# Patient Record
Sex: Female | Born: 1955 | Race: White | Hispanic: No | Marital: Single | Smoking: Never smoker
Health system: Southern US, Community
[De-identification: ages and names within clinical notes are randomized; demographics above are authoritative.]

## PROBLEM LIST (undated history)

## (undated) DIAGNOSIS — E785 Hyperlipidemia, unspecified: Secondary | ICD-10-CM

## (undated) DIAGNOSIS — Z87442 Personal history of urinary calculi: Secondary | ICD-10-CM

## (undated) DIAGNOSIS — R112 Nausea with vomiting, unspecified: Secondary | ICD-10-CM

## (undated) DIAGNOSIS — Z8701 Personal history of pneumonia (recurrent): Secondary | ICD-10-CM

## (undated) DIAGNOSIS — G454 Transient global amnesia: Secondary | ICD-10-CM

## (undated) DIAGNOSIS — E538 Deficiency of other specified B group vitamins: Secondary | ICD-10-CM

## (undated) DIAGNOSIS — R079 Chest pain, unspecified: Secondary | ICD-10-CM

## (undated) DIAGNOSIS — Z9889 Other specified postprocedural states: Secondary | ICD-10-CM

## (undated) DIAGNOSIS — N200 Calculus of kidney: Secondary | ICD-10-CM

## (undated) DIAGNOSIS — M79606 Pain in leg, unspecified: Secondary | ICD-10-CM

## (undated) HISTORY — DX: Hyperlipidemia, unspecified: E78.5

## (undated) HISTORY — DX: Chest pain, unspecified: R07.9

## (undated) HISTORY — DX: Deficiency of other specified B group vitamins: E53.8

## (undated) HISTORY — DX: Transient global amnesia: G45.4

## (undated) HISTORY — DX: Pain in leg, unspecified: M79.606

## (undated) HISTORY — DX: Personal history of pneumonia (recurrent): Z87.01

## (undated) HISTORY — DX: Calculus of kidney: N20.0

## (undated) HISTORY — DX: Personal history of urinary calculi: Z87.442

---

## 1988-11-13 DIAGNOSIS — Z8701 Personal history of pneumonia (recurrent): Secondary | ICD-10-CM

## 1988-11-13 HISTORY — DX: Personal history of pneumonia (recurrent): Z87.01

## 1992-11-13 HISTORY — PX: CHOLECYSTECTOMY: SHX55

## 1996-11-13 HISTORY — PX: EXPLORATORY LAPAROTOMY: SUR591

## 1997-11-13 HISTORY — PX: VAGINAL HYSTERECTOMY: SUR661

## 1998-10-26 ENCOUNTER — Inpatient Hospital Stay (HOSPITAL_COMMUNITY): Admission: RE | Admit: 1998-10-26 | Discharge: 1998-10-27 | Payer: Self-pay | Admitting: Obstetrics and Gynecology

## 2000-11-21 ENCOUNTER — Encounter: Payer: Self-pay | Admitting: Obstetrics and Gynecology

## 2000-11-21 ENCOUNTER — Encounter: Admission: RE | Admit: 2000-11-21 | Discharge: 2000-11-21 | Payer: Self-pay | Admitting: Obstetrics and Gynecology

## 2005-02-02 ENCOUNTER — Ambulatory Visit: Payer: Self-pay | Admitting: Cardiology

## 2005-02-08 ENCOUNTER — Ambulatory Visit: Payer: Self-pay

## 2005-02-08 ENCOUNTER — Ambulatory Visit: Payer: Self-pay | Admitting: Cardiology

## 2010-10-27 ENCOUNTER — Ambulatory Visit: Payer: Self-pay | Admitting: Family Medicine

## 2010-11-13 HISTORY — PX: VEIN LIGATION AND STRIPPING: SHX2653

## 2011-01-17 ENCOUNTER — Telehealth: Payer: Self-pay | Admitting: Cardiology

## 2011-01-23 ENCOUNTER — Ambulatory Visit: Payer: Self-pay | Admitting: Internal Medicine

## 2011-01-24 NOTE — Progress Notes (Signed)
Summary: hands turning blue  Phone Note Call from Patient Call back at [phone]   Caller: Patient Reason for Call: Talk to Nurse Summary of Call: pt states her hands started truning blue. pt states hands were blue all day yesterday. blood pressure was 117/77 last night.  pt has been having headaches as well. pt has not been here since 2006.  Initial call taken by: Roe Coombs,  January 17, 2011 9:21 AM  Follow-up for Phone Call        yesterday am - hand started turning blue (mainly palms of her hands)- came and went,  last night it went away.  Checked BP and it was OK.  Goes to headache clinic for mirgraines  but doesn't have a primary care MD.  Under alot of stress, going thru a divorce.  Pain in back between shoulder blades like a twitching.  Twitching also in face.   Also alot of fatigue.  not sleeping well.  SOB walking up stairs for the last several months. Does have a family history of CAD.  No chest pain.  Requested pt report to urgent care for eval and referral Follow-up by: Charolotte Capuchin, RN,  January 17, 2011 11:35 AM

## 2011-02-01 ENCOUNTER — Ambulatory Visit (INDEPENDENT_AMBULATORY_CARE_PROVIDER_SITE_OTHER): Payer: BC Managed Care – PPO | Admitting: Family Medicine

## 2011-02-01 ENCOUNTER — Encounter: Payer: Self-pay | Admitting: Family Medicine

## 2011-02-01 VITALS — BP 122/68 | HR 80 | Temp 98.3°F | Ht 62.5 in | Wt 158.1 lb

## 2011-02-01 DIAGNOSIS — N200 Calculus of kidney: Secondary | ICD-10-CM | POA: Insufficient documentation

## 2011-02-01 DIAGNOSIS — G43909 Migraine, unspecified, not intractable, without status migrainosus: Secondary | ICD-10-CM

## 2011-02-01 DIAGNOSIS — I998 Other disorder of circulatory system: Secondary | ICD-10-CM

## 2011-02-01 DIAGNOSIS — G43009 Migraine without aura, not intractable, without status migrainosus: Secondary | ICD-10-CM | POA: Insufficient documentation

## 2011-02-01 DIAGNOSIS — H409 Unspecified glaucoma: Secondary | ICD-10-CM | POA: Insufficient documentation

## 2011-02-01 DIAGNOSIS — R5383 Other fatigue: Secondary | ICD-10-CM

## 2011-02-01 DIAGNOSIS — R5381 Other malaise: Secondary | ICD-10-CM

## 2011-02-01 DIAGNOSIS — R0789 Other chest pain: Secondary | ICD-10-CM

## 2011-02-01 DIAGNOSIS — I878 Other specified disorders of veins: Secondary | ICD-10-CM

## 2011-02-01 MED ORDER — ASPIRIN EC 81 MG PO TBEC: 81.0000 mg | DELAYED_RELEASE_TABLET | ORAL | Status: DC

## 2011-02-01 NOTE — Patient Instructions (Addendum)
Return at your convenience fasting for blood work. Start baby aspirin every other day or three times a week to see if we can help with the veins. We will call you with results of blood work. Return at your convenience in next few months for complete physical. Randie Heinz to meet you today, call clinic with questions.

## 2011-02-01 NOTE — Progress Notes (Signed)
Subjective:    Patient ID: Kelly Francis, female    DOB: May 17, 1956, 54 y.o.   MRN: [id number]  CC: new patient, establish  HPI Kelly Francis presents today to establish care with PCP, has had none for several years.  Prior saw Dr. Melrose Nakayama.    Migraines - on topamax, [facility] to [facility])  Tingling sensation - Bad migraine 01/06/2010, given injections at [facility].  Felt nauseated during injections, felt "funny".  1 wk later started having chest/R arm tingling as well as neck and back tingling.  Also noticed recently increasing fatigue.  Notes increased work stress and stress at home.  Currently going through bad divorce.  However doesn't think anxiety contributing because doesn't tend to get upset/anxious.  Also starting to have indigestion, when awakens.  Also with pain in middle of back.  All of this is intermittent.  A main concern she has is that she has started noticing veins protruding in hands, feet, no pain.  Worried because has strong family history of CAD.  Surgical Menopause - hyst age 47.  Preventative: Well woman last year with [facility]), normal pap, breast in last year, nl mammo (due for mammo but will set up through OBGYN).   Colon screening - hasn't discussed. Tetanus shot - unsure, requests to hold off for now. Recent blood work at [facility] clinic, but unsure what.  Requests blood work today.  Allergies, meds, PMHx, SurgHx, SHx, and FmHx reviewed for relevance.  Review of Systems  Constitutional: Negative for fever, chills, activity change, appetite change, fatigue and unexpected weight change.  HENT: Negative for hearing loss and neck pain.   Eyes: Negative for visual disturbance.  Respiratory: Negative for cough, chest tightness, shortness of breath and wheezing.   Cardiovascular: Negative for chest pain and leg swelling.  Gastrointestinal: Negative for nausea, vomiting, abdominal pain, diarrhea, constipation, blood in stool, abdominal  distention and anal bleeding.  Genitourinary: Negative for hematuria and difficulty urinating.  Musculoskeletal: Negative for myalgias and arthralgias.  Skin: Negative for color change and rash.  Neurological: Negative for dizziness, seizures, syncope and headaches.  Psychiatric/Behavioral: Negative for dysphoric mood. The patient is not nervous/anxious.        Objective:   Physical Exam  Nursing note and vitals reviewed. Constitutional: She is oriented to person, place, and time. She appears well-developed and well-nourished.  HENT:  Head: Normocephalic and atraumatic.  Right Ear: External ear normal.  Left Ear: External ear normal.  Nose: Nose normal.  Mouth/Throat: Oropharynx is clear and moist.  Eyes: Conjunctivae and EOM are normal. Pupils are equal, round, and reactive to light.  Neck: Normal range of motion. Neck supple. No thyromegaly present.  Cardiovascular: Normal rate, regular rhythm, normal heart sounds and intact distal pulses.   Pulses:      Radial pulses are 2+ on the right side, and 2+ on the left side.       Dorsalis pedis pulses are 2+ on the right side, and 2+ on the left side.       Marked veins dorsal hands and feet.  Also medial ankles with reticular veins, nontender  Pulmonary/Chest: Effort normal and breath sounds normal. She has no wheezes. She has no rales.  Abdominal: Soft. Bowel sounds are normal. She exhibits no distension and no mass. There is no tenderness. There is no rebound and no guarding.  Musculoskeletal: Normal range of motion.  Lymphadenopathy:    She has no cervical adenopathy.  Neurological: She is  alert and oriented to person, place, and time.       CN grossly intact, station and gait intact  Skin: Skin is warm and dry.  Psychiatric: She has a normal mood and affect. Her behavior is normal. Judgment and thought content normal.      Assessment & Plan:

## 2011-02-02 DIAGNOSIS — I878 Other specified disorders of veins: Secondary | ICD-10-CM | POA: Insufficient documentation

## 2011-02-02 DIAGNOSIS — R0789 Other chest pain: Secondary | ICD-10-CM | POA: Insufficient documentation

## 2011-02-02 DIAGNOSIS — R5383 Other fatigue: Secondary | ICD-10-CM | POA: Insufficient documentation

## 2011-02-02 NOTE — Assessment & Plan Note (Signed)
Going on 3 wks.  Check blood uwork.pdate on results.

## 2011-02-02 NOTE — Assessment & Plan Note (Signed)
Followed by [facility] (Dr. [facility]).  Currently stable.

## 2011-02-02 NOTE — Assessment & Plan Note (Addendum)
Unclear etiology, doesn't sound cardiac in nature.  No outright pain/discomfort.  No SOB.  More tingling.

## 2011-02-02 NOTE — Assessment & Plan Note (Signed)
With some reticulate veins ankles. Recommended start with baby aspirin qod. Check blood work today,

## 2011-02-03 ENCOUNTER — Telehealth: Payer: Self-pay | Admitting: Physician Assistant

## 2011-02-03 NOTE — Telephone Encounter (Signed)
Pt called tonight to report that she is having a myriad of symptoms. She has not yet been seen by a [facility] cardiology physician but states she has an appointment with [place] on Monday. She is followed in a headache clinic for migraines and had injections of the temple several weeks ago, and since then has occasional blue discoloration of her hands, strange sensations down her neck/arms/back, tingling in her left hand, and 2 weeks ago she states she noticed intermittent bulging of the "blood vessels" in her hands. Advised pt to call physician that did injections but pt states she'd rather have our opinion. She states she saw Dr. Ephraim Hamburger at [facility] last week but do not see any documentation from that visit in EMR, was told to take ASA apparently. She noticed "little welps" on her pinkie tonight and became concerned, was wondering if we could see her. I informed her that our office is closed this weekend and she should proceed to ER if she is concerned that things are changing. She does not want to go tonight but will if things get worse. Also advised she urgent care visit tomorrow to be evaluated in person as it is difficult to assess exactly what's going on over the phone. She denies any CP/SOB. Told her to keep our office informed of what her eval over the weekend shows, whether she decides ER vs Urgent Care. She expressed understanding.

## 2011-02-04 ENCOUNTER — Emergency Department (HOSPITAL_COMMUNITY): Payer: BC Managed Care – PPO

## 2011-02-04 ENCOUNTER — Emergency Department (HOSPITAL_COMMUNITY)
Admission: EM | Admit: 2011-02-04 | Discharge: 2011-02-04 | Disposition: A | Discharge status: Home / Self Care | Payer: BC Managed Care – PPO | Attending: Emergency Medicine | Admitting: Emergency Medicine

## 2011-02-04 DIAGNOSIS — M546 Pain in thoracic spine: Secondary | ICD-10-CM | POA: Insufficient documentation

## 2011-02-04 DIAGNOSIS — R21 Rash and other nonspecific skin eruption: Secondary | ICD-10-CM | POA: Insufficient documentation

## 2011-02-04 DIAGNOSIS — M7989 Other specified soft tissue disorders: Secondary | ICD-10-CM | POA: Insufficient documentation

## 2011-02-04 DIAGNOSIS — K3189 Other diseases of stomach and duodenum: Secondary | ICD-10-CM | POA: Insufficient documentation

## 2011-02-04 DIAGNOSIS — E871 Hypo-osmolality and hyponatremia: Secondary | ICD-10-CM | POA: Insufficient documentation

## 2011-02-04 LAB — DIFFERENTIAL
Basophils Absolute: 0 10*3/uL (ref 0.0–0.1)
Basophils Relative: 1 % (ref 0–1)
Eosinophils Absolute: 0 10*3/uL (ref 0.0–0.7)
Eosinophils Relative: 0 % (ref 0–5)
Lymphocytes Relative: 31 % (ref 12–46)
Lymphs Abs: 1.8 10*3/uL (ref 0.7–4.0)
Monocytes Absolute: 0.5 10*3/uL (ref 0.1–1.0)
Monocytes Relative: 8 % (ref 3–12)
Neutro Abs: 3.6 10*3/uL (ref 1.7–7.7)
Neutrophils Relative %: 61 % (ref 43–77)

## 2011-02-04 LAB — CBC
HCT: 40.1 % (ref 36.0–46.0)
Hemoglobin: 13.6 g/dL (ref 12.0–15.0)
MCH: 30.3 pg (ref 26.0–34.0)
MCHC: 33.9 g/dL (ref 30.0–36.0)
MCV: 89.3 fL (ref 78.0–100.0)
Platelets: 206 10*3/uL (ref 150–400)
RBC: 4.49 MIL/uL (ref 3.87–5.11)
RDW: 12.9 % (ref 11.5–15.5)
WBC: 5.9 10*3/uL (ref 4.0–10.5)

## 2011-02-04 LAB — COMPREHENSIVE METABOLIC PANEL
ALT: 12 U/L (ref 0–35)
AST: 18 U/L (ref 0–37)
Albumin: 3.9 g/dL (ref 3.5–5.2)
Alkaline Phosphatase: 76 U/L (ref 39–117)
BUN: 16 mg/dL (ref 6–23)
CO2: 23 mEq/L (ref 19–32)
Calcium: 9 mg/dL (ref 8.4–10.5)
Chloride: 104 mEq/L (ref 96–112)
Creatinine, Ser: 0.65 mg/dL (ref 0.4–1.2)
GFR calc Af Amer: >60 mL/min (ref >60)
GFR calc non Af Amer: >60 mL/min (ref >60)
Glucose, Bld: 104 mg/dL — ABNORMAL HIGH (ref 70–99)
Potassium: 3.6 mEq/L (ref 3.5–5.1)
Sodium: 134 mEq/L — ABNORMAL LOW (ref 135–145)
Total Bilirubin: 0.5 mg/dL (ref 0.3–1.2)
Total Protein: 6.8 g/dL (ref 6.0–8.3)

## 2011-02-04 LAB — D-DIMER, QUANTITATIVE: D-Dimer, Quant: <0.22 ug/mL-FEU (ref 0.00–0.48)

## 2011-02-06 ENCOUNTER — Ambulatory Visit (INDEPENDENT_AMBULATORY_CARE_PROVIDER_SITE_OTHER): Payer: BC Managed Care – PPO | Admitting: Cardiovascular Disease

## 2011-02-06 ENCOUNTER — Other Ambulatory Visit (INDEPENDENT_AMBULATORY_CARE_PROVIDER_SITE_OTHER): Payer: BC Managed Care – PPO | Admitting: Family Medicine

## 2011-02-06 ENCOUNTER — Encounter: Payer: Self-pay | Admitting: Cardiovascular Disease

## 2011-02-06 DIAGNOSIS — R5381 Other malaise: Secondary | ICD-10-CM

## 2011-02-06 DIAGNOSIS — R5383 Other fatigue: Secondary | ICD-10-CM

## 2011-02-06 DIAGNOSIS — G43009 Migraine without aura, not intractable, without status migrainosus: Secondary | ICD-10-CM

## 2011-02-06 DIAGNOSIS — I878 Other specified disorders of veins: Secondary | ICD-10-CM

## 2011-02-06 DIAGNOSIS — M79609 Pain in unspecified limb: Secondary | ICD-10-CM

## 2011-02-06 DIAGNOSIS — I998 Other disorder of circulatory system: Secondary | ICD-10-CM

## 2011-02-06 DIAGNOSIS — I83893 Varicose veins of bilateral lower extremities with other complications: Secondary | ICD-10-CM

## 2011-02-06 DIAGNOSIS — R0789 Other chest pain: Secondary | ICD-10-CM

## 2011-02-06 DIAGNOSIS — M79603 Pain in arm, unspecified: Secondary | ICD-10-CM

## 2011-02-06 DIAGNOSIS — I83899 Varicose veins of unspecified lower extremities with other complications: Secondary | ICD-10-CM

## 2011-02-06 LAB — CBC WITH DIFFERENTIAL/PLATELET
Basophils Absolute: 0 10*3/uL (ref 0.0–0.1)
Basophils Relative: 0.7 % (ref 0.0–3.0)
Eosinophils Absolute: 0 10*3/uL (ref 0.0–0.7)
Eosinophils Relative: 0.5 % (ref 0.0–5.0)
HCT: 41.1 % (ref 36.0–46.0)
Hemoglobin: 13.9 g/dL (ref 12.0–15.0)
Lymphocytes Relative: 31.3 % (ref 12.0–46.0)
Lymphs Abs: 1.7 10*3/uL (ref 0.7–4.0)
MCHC: 33.8 g/dL (ref 30.0–36.0)
MCV: 91.1 fl (ref 78.0–100.0)
Monocytes Absolute: 0.4 10*3/uL (ref 0.1–1.0)
Monocytes Relative: 7.9 % (ref 3.0–12.0)
Neutro Abs: 3.2 10*3/uL (ref 1.4–7.7)
Neutrophils Relative %: 59.6 % (ref 43.0–77.0)
Platelets: 201 10*3/uL (ref 150.0–400.0)
RBC: 4.51 Mil/uL (ref 3.87–5.11)
RDW: 13.7 % (ref 11.5–14.6)
WBC: 5.3 10*3/uL (ref 4.5–10.5)

## 2011-02-06 LAB — COMPREHENSIVE METABOLIC PANEL
ALT: 12 U/L (ref 0–35)
AST: 14 U/L (ref 0–37)
Albumin: 3.9 g/dL (ref 3.5–5.2)
Alkaline Phosphatase: 85 U/L (ref 39–117)
BUN: 17 mg/dL (ref 6–23)
CO2: 28 mEq/L (ref 19–32)
Calcium: 9.3 mg/dL (ref 8.4–10.5)
Chloride: 106 mEq/L (ref 96–112)
Creatinine, Ser: 0.5 mg/dL (ref 0.4–1.2)
GFR: 128.19 mL/min (ref >60.00)
Glucose, Bld: 88 mg/dL (ref 70–99)
Potassium: 4.2 mEq/L (ref 3.5–5.1)
Sodium: 139 mEq/L (ref 135–145)
Total Bilirubin: 0.4 mg/dL (ref 0.3–1.2)
Total Protein: 6.7 g/dL (ref 6.0–8.3)

## 2011-02-06 LAB — LIPID PANEL
Cholesterol: 196 mg/dL (ref 0–200)
HDL: 81 mg/dL (ref >39.00)
LDL Cholesterol: 96 mg/dL (ref 0–99)
Total CHOL/HDL Ratio: 2
Triglycerides: 94 mg/dL (ref 0.0–149.0)
VLDL: 18.8 mg/dL (ref 0.0–40.0)

## 2011-02-06 LAB — VITAMIN B12: Vitamin B-12: 103 pg/mL — ABNORMAL LOW (ref 211–911)

## 2011-02-06 LAB — TSH: TSH: 0.94 u[IU]/mL (ref 0.35–5.50)

## 2011-02-06 LAB — BRAIN NATRIURETIC PEPTIDE: Pro B Natriuretic peptide (BNP): 37.8 pg/mL (ref 0.0–100.0)

## 2011-02-06 NOTE — Progress Notes (Signed)
   Patient ID: Kelly Francis, female    DOB: 03-11-1956, 55 y.o.   MRN: [id number]  HPI Comments: Pleasant 55 year old woman with no known cardiac history, with history of migraines who presents for evaluation of evaluation of hand and foot vein distention.  She reports that over the past several months, she has noticed increasing distention of the veins in her hands and feet. She has occasional shortness of breath, occasional pain down her arms described as a tingling. She has had numerous shots for migraine type related tension in her head and neck and wonders if this could have something to do with her symptoms. She denies any significant shortness of breath with exertion. She denies any lower extremity edema.  She did go to the emergency room on March 24 and had a complete workup including chest x-ray which was normal, d-dimer which was normal, oral blood panel to a normal CBC, basic metabolic panel with borderline low sodium of 134, normal BNP, vitamin B12 which was low. Also normal TSH.  She is able to exert herself without any difficulty. She plans on going to [place] next week for vacation.  EKG shows normal sinus rhythm with rate 76 beats per minute with no significant ST or T wave changes     Review of Systems  Constitutional: Positive for fatigue.  HENT: Negative.   Eyes: Negative.   Respiratory: Negative.   Cardiovascular:       Swelling of the veins in her hands and feet, some arm discomfort described as a tingling also with chronic neck discomfort.  Gastrointestinal: Negative.   Musculoskeletal: Negative.   Skin: Negative.   Neurological: Negative.   Hematological: Negative.   Psychiatric/Behavioral: Negative.   All other systems reviewed and are negative.    BP 120/84  Pulse 76  Ht 5\' 2"  (1.575 m)  Wt 159 lb (72.122 kg)  BMI 29.08 kg/m2   Physical Exam  Nursing note and vitals reviewed. Constitutional: She is oriented to person, place, and time. She appears  well-developed and well-nourished.  HENT:  Head: Normocephalic.  Nose: Nose normal.  Mouth/Throat: Oropharynx is clear and moist.  Eyes: Conjunctivae are normal. Pupils are equal, round, and reactive to light.  Neck: Normal range of motion. Neck supple. No JVD present.  Cardiovascular: Normal rate, regular rhythm, normal heart sounds and intact distal pulses.  Exam reveals no gallop and no friction rub.   No murmur heard. Pulmonary/Chest: Effort normal and breath sounds normal. No respiratory distress. She has no wheezes. She has no rales. She exhibits no tenderness.  Abdominal: Soft. Bowel sounds are normal. She exhibits no distension. There is no tenderness.  Musculoskeletal: Normal range of motion. She exhibits no edema and no tenderness.  Lymphadenopathy:    She has no cervical adenopathy.  Neurological: She is alert and oriented to person, place, and time. Coordination normal.  Skin: Skin is warm and dry. No rash noted. No erythema.  Psychiatric: She has a normal mood and affect. Her behavior is normal. Judgment and thought content normal.    Assessment and Plan

## 2011-02-06 NOTE — Assessment & Plan Note (Signed)
She does have other issues such as fatigue, neck discomfort. This is likely unrelated to any cardiac issue. She is able to exert herself without any symptoms.

## 2011-02-06 NOTE — Assessment & Plan Note (Addendum)
Etiology of her prominent veins in her hands and feet is likely not a pathologic pain. I do not believe that additional workup is needed. 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 This could be related to the warmer weather. She does use her hands frequently for typing and has very muscular hands. With elevation of her hands, her veins rapidly disappear indicating she does not have high central venous pressure.  We did offer an echocardiogram though I suspect this would be confirming that she does not have any issue. She would like to wait on the echocardiogram. I did not think that she needs a diuretic or other workup. It is reassuring that her BNP and d-dimer is normal.  We did mention that she could do a slow wean off the clonidine to see if this makes any difference with improvement of her veins. She could try half a tab for one week before discontinuing for a trial.

## 2011-02-06 NOTE — Patient Instructions (Signed)
No medications were made today. Please follow up with out clinic as needed. We did discuss possibly performing an echo if you get worsening symptoms. Please call us if we can be of further help.

## 2011-02-06 NOTE — Assessment & Plan Note (Signed)
She does have chronic issues with migraines. I will defer this to Dr. Sharen Hones.

## 2011-02-07 ENCOUNTER — Telehealth: Payer: Self-pay | Admitting: Family Medicine

## 2011-02-07 ENCOUNTER — Encounter: Payer: Self-pay | Admitting: Family Medicine

## 2011-02-07 LAB — VITAMIN D 25 HYDROXY (VIT D DEFICIENCY, FRACTURES): Vit D, 25-Hydroxy: 48 ng/mL (ref 30–89)

## 2011-02-07 NOTE — Telephone Encounter (Signed)
Confirming or of the B-12 IM monthly? Thanks!

## 2011-02-07 NOTE — Telephone Encounter (Signed)
Patient notified and monthly B-12 injections scheduled.

## 2011-02-07 NOTE — Telephone Encounter (Signed)
 monthly.  Thanks.

## 2011-02-07 NOTE — Telephone Encounter (Signed)
Please notify sodium normal range now, kidneys, liver, complete blood count, thyroid all normal.  Cholesterol levels were all normal range.  Vitamin D level was 48, normal.  Offer to send copy of blood work.   B12 was low at 103.  This may be accounting for tingling sensation?  Recommend start B12 shots monthly for 8 months then may either continue or go to oral daily supplementation.

## 2011-02-09 ENCOUNTER — Ambulatory Visit (INDEPENDENT_AMBULATORY_CARE_PROVIDER_SITE_OTHER): Payer: BC Managed Care – PPO | Admitting: Family Medicine

## 2011-02-09 DIAGNOSIS — R5381 Other malaise: Secondary | ICD-10-CM

## 2011-02-09 DIAGNOSIS — R5383 Other fatigue: Secondary | ICD-10-CM

## 2011-02-09 MED ORDER — CYANOCOBALAMIN 1000 MCG/ML IJ SOLN
1000.0000 ug | Freq: Once | INTRAMUSCULAR | Status: AC
  Administered 2011-02-09: 1000 ug via INTRAMUSCULAR

## 2011-02-10 NOTE — Progress Notes (Signed)
B12 injection given

## 2011-02-12 DIAGNOSIS — E538 Deficiency of other specified B group vitamins: Secondary | ICD-10-CM

## 2011-02-12 HISTORY — DX: Deficiency of other specified B group vitamins: E53.8

## 2011-03-01 ENCOUNTER — Encounter: Payer: Self-pay | Admitting: Family Medicine

## 2011-03-01 ENCOUNTER — Ambulatory Visit (INDEPENDENT_AMBULATORY_CARE_PROVIDER_SITE_OTHER): Payer: BC Managed Care – PPO | Admitting: Family Medicine

## 2011-03-01 VITALS — BP 110/78 | HR 80 | Temp 98.4°F | Ht 63.5 in | Wt 162.1 lb

## 2011-03-01 DIAGNOSIS — I998 Other disorder of circulatory system: Secondary | ICD-10-CM

## 2011-03-01 DIAGNOSIS — R5381 Other malaise: Secondary | ICD-10-CM

## 2011-03-01 DIAGNOSIS — R5383 Other fatigue: Secondary | ICD-10-CM

## 2011-03-01 DIAGNOSIS — J309 Allergic rhinitis, unspecified: Secondary | ICD-10-CM

## 2011-03-01 DIAGNOSIS — E538 Deficiency of other specified B group vitamins: Secondary | ICD-10-CM

## 2011-03-01 DIAGNOSIS — Z1211 Encounter for screening for malignant neoplasm of colon: Secondary | ICD-10-CM

## 2011-03-01 DIAGNOSIS — I878 Other specified disorders of veins: Secondary | ICD-10-CM

## 2011-03-01 DIAGNOSIS — Z0000 Encounter for general adult medical examination without abnormal findings: Secondary | ICD-10-CM | POA: Insufficient documentation

## 2011-03-01 DIAGNOSIS — Z9109 Other allergy status, other than to drugs and biological substances: Secondary | ICD-10-CM

## 2011-03-01 DIAGNOSIS — Z0001 Encounter for general adult medical examination with abnormal findings: Secondary | ICD-10-CM | POA: Insufficient documentation

## 2011-03-01 MED ORDER — LORATADINE 10 MG PO TABS: 10.0000 mg | ORAL_TABLET | Freq: Every day | ORAL | Status: DC

## 2011-03-01 MED ORDER — CYANOCOBALAMIN 1000 MCG/ML IJ SOLN
1000.0000 ug | Freq: Once | INTRAMUSCULAR | Status: AC
  Administered 2011-03-01: 1000 ug via INTRAMUSCULAR

## 2011-03-01 NOTE — Progress Notes (Signed)
  Subjective:    Patient ID: Kelly Francis, female    DOB: 07-Oct-1956, 55 y.o.   MRN: [id number]  HPI CC: CPE   Tingling actually better since starting B12 shots.  Also has been taking oral supplements.  Fatigue - continued.  Not better.    Now feeling congested, coughing, feels sputum stuck in throat.  Has had blood as well.  Feels going into ears.  Taking [employer] day medicine.  H/o seasonal allergies - actually worse.  No sick contacts at home.  No smokers at home.  No abd pain, fevers/chills, abd pain, n/v, rashes.  No h/o asthma or COPD.  Preventative:  Well woman last year with [facility] [employer] [facility]), normal pap, breast in last year, nl mammo (due for mammo but will set up through OBGYN).  Colon screening - no family h/o colon cancer, no blood in stool, BM changes.  Had colonoscopy done as well as EGD for IBS (done by [facility], pt thinks ~1980).  s/p cholecystectomy.  Requests iFOB today. Tetanus shot - unsure, declines today.  Medications and allergies reviewed and updated as above. PMhx, SHx, surghx, fmhx reviewed for relevance.  Review of Systems  Constitutional: Negative for fever, chills, activity change, appetite change, fatigue and unexpected weight change.  HENT: Negative for hearing loss and neck pain.   Eyes: Negative for visual disturbance.  Respiratory: Positive for cough. Negative for chest tightness, shortness of breath and wheezing.   Cardiovascular: Negative for chest pain, palpitations and leg swelling.  Gastrointestinal: Negative for nausea, vomiting, abdominal pain, diarrhea, constipation, blood in stool and abdominal distention.  Genitourinary: Negative for hematuria and difficulty urinating.  Musculoskeletal: Negative for myalgias and arthralgias.  Skin: Negative for rash.  Neurological: Positive for headaches. Negative for dizziness, seizures and syncope.  Hematological: Does not bruise/bleed easily.  Psychiatric/Behavioral: Negative for dysphoric mood. The  patient is not nervous/anxious.        Objective:   Physical Exam  Nursing note and vitals reviewed. Constitutional: She is oriented to person, place, and time. She appears well-developed and well-nourished.  HENT:  Head: Normocephalic and atraumatic.  Right Ear: External ear normal.  Left Ear: External ear normal.  Nose: Nose normal.  Mouth/Throat: Oropharynx is clear and moist.  Eyes: Conjunctivae and EOM are normal. Pupils are equal, round, and reactive to light.  Neck: Normal range of motion. Neck supple. No thyromegaly present.  Cardiovascular: Normal rate, regular rhythm, normal heart sounds and intact distal pulses.   No murmur heard. Pulses:      Radial pulses are 2+ on the right side, and 2+ on the left side.  Pulmonary/Chest: Effort normal and breath sounds normal. No respiratory distress. She has no wheezes. She has no rales.  Abdominal: Soft. Bowel sounds are normal. She exhibits no distension and no mass. There is no tenderness. There is no rebound and no guarding.  Musculoskeletal: Normal range of motion. She exhibits no edema.  Lymphadenopathy:    She has no cervical adenopathy.  Neurological: She is alert and oriented to person, place, and time.       CN grossly intact, station and gait intact  Skin: Skin is warm and dry. No rash noted.  Psychiatric: She has a normal mood and affect. Her behavior is normal. Judgment and thought content normal.   Breast/gyn deferred to OB.       Assessment & Plan:

## 2011-03-01 NOTE — Assessment & Plan Note (Addendum)
Reviewed preventive protocols and updated unless pt declined. Discussed blood work, provided with copy. iFOB today.  No fmhx colon cancer, no BM changes or blood in stool

## 2011-03-01 NOTE — Assessment & Plan Note (Signed)
Stable. No pain, pressure sensation, pt will continue to monitor and notify us if change.

## 2011-03-01 NOTE — Assessment & Plan Note (Signed)
Recommend start claritin and nasal saline irrigation, return if worsening or not helping.

## 2011-03-01 NOTE — Patient Instructions (Addendum)
We will send you home with stool kit today. Consider checking with [facility] about last colonoscopy. For allergies - nasal saline irrigation twice a day as well as daily claritin for next 1-2 wks.  Also start either simple mucinex or immediate release guaifenesin with plenty of fluid. Update [place] on how you're doing after trying this.  Notify sooner if any fevers (>101.5), worsening cough, or not improving as expected.

## 2011-03-01 NOTE — Assessment & Plan Note (Signed)
Found to have low B12, on monthly supplementation.  No significant improvement in fatigue, continue to monitor.  Has noted improvement in tingling sensation she was having.

## 2011-03-06 ENCOUNTER — Encounter: Payer: Self-pay | Admitting: Cardiovascular Disease

## 2011-03-07 ENCOUNTER — Ambulatory Visit: Payer: BC Managed Care – PPO

## 2011-03-09 ENCOUNTER — Ambulatory Visit: Payer: BC Managed Care – PPO

## 2011-03-13 ENCOUNTER — Other Ambulatory Visit: Payer: BC Managed Care – PPO

## 2011-03-13 ENCOUNTER — Other Ambulatory Visit (INDEPENDENT_AMBULATORY_CARE_PROVIDER_SITE_OTHER): Payer: BC Managed Care – PPO | Admitting: Family Medicine

## 2011-03-13 DIAGNOSIS — Z1211 Encounter for screening for malignant neoplasm of colon: Secondary | ICD-10-CM

## 2011-03-13 LAB — FECAL OCCULT BLOOD, IMMUNOCHEMICAL: Fecal Occult Bld: NEGATIVE

## 2011-03-14 ENCOUNTER — Encounter: Payer: Self-pay | Admitting: *Deleted

## 2011-03-16 NOTE — Progress Notes (Signed)
Addended byMills Koller on: 03/16/2011 11:16 AM   Modules accepted: Orders

## 2011-04-11 ENCOUNTER — Ambulatory Visit (INDEPENDENT_AMBULATORY_CARE_PROVIDER_SITE_OTHER): Payer: BC Managed Care – PPO | Admitting: Family Medicine

## 2011-04-11 DIAGNOSIS — R5381 Other malaise: Secondary | ICD-10-CM

## 2011-04-11 DIAGNOSIS — R5383 Other fatigue: Secondary | ICD-10-CM

## 2011-04-11 MED ORDER — CYANOCOBALAMIN 1000 MCG/ML IJ SOLN
1000.0000 ug | Freq: Once | INTRAMUSCULAR | Status: AC
  Administered 2011-04-11: 1000 ug via INTRAMUSCULAR

## 2011-04-11 NOTE — Progress Notes (Signed)
B12 injection given today during nurse visit. 

## 2011-04-13 ENCOUNTER — Ambulatory Visit: Payer: BC Managed Care – PPO

## 2011-05-09 ENCOUNTER — Ambulatory Visit (INDEPENDENT_AMBULATORY_CARE_PROVIDER_SITE_OTHER): Payer: BC Managed Care – PPO | Admitting: Family Medicine

## 2011-05-09 DIAGNOSIS — R5383 Other fatigue: Secondary | ICD-10-CM

## 2011-05-09 DIAGNOSIS — R5381 Other malaise: Secondary | ICD-10-CM

## 2011-05-09 MED ORDER — CYANOCOBALAMIN 1000 MCG/ML IJ SOLN
1000.0000 ug | Freq: Once | INTRAMUSCULAR | Status: AC
  Administered 2011-05-09: 1000 ug via INTRAMUSCULAR

## 2011-05-09 NOTE — Progress Notes (Signed)
b12 shot 

## 2011-05-11 ENCOUNTER — Ambulatory Visit: Payer: BC Managed Care – PPO

## 2011-06-08 ENCOUNTER — Ambulatory Visit: Payer: BC Managed Care – PPO

## 2011-06-13 ENCOUNTER — Ambulatory Visit (INDEPENDENT_AMBULATORY_CARE_PROVIDER_SITE_OTHER): Payer: BC Managed Care – PPO | Admitting: Family Medicine

## 2011-06-13 DIAGNOSIS — R5381 Other malaise: Secondary | ICD-10-CM

## 2011-06-13 DIAGNOSIS — R5383 Other fatigue: Secondary | ICD-10-CM

## 2011-06-13 MED ORDER — CYANOCOBALAMIN 1000 MCG/ML IJ SOLN
1000.0000 ug | Freq: Once | INTRAMUSCULAR | Status: AC
  Administered 2011-06-13: 1000 ug via INTRAMUSCULAR

## 2011-06-13 NOTE — Progress Notes (Signed)
B12 injection given during nurse visit today. 

## 2011-07-13 ENCOUNTER — Ambulatory Visit: Payer: BC Managed Care – PPO

## 2011-07-18 ENCOUNTER — Ambulatory Visit (INDEPENDENT_AMBULATORY_CARE_PROVIDER_SITE_OTHER): Payer: BC Managed Care – PPO | Admitting: *Deleted

## 2011-07-18 DIAGNOSIS — E538 Deficiency of other specified B group vitamins: Secondary | ICD-10-CM

## 2011-07-18 MED ORDER — CYANOCOBALAMIN 1000 MCG/ML IJ SOLN
1000.0000 ug | Freq: Once | INTRAMUSCULAR | Status: AC
  Administered 2011-07-18: 1000 ug via INTRAMUSCULAR

## 2011-08-10 ENCOUNTER — Ambulatory Visit: Payer: BC Managed Care – PPO

## 2011-08-15 ENCOUNTER — Ambulatory Visit (INDEPENDENT_AMBULATORY_CARE_PROVIDER_SITE_OTHER): Payer: BC Managed Care – PPO | Admitting: *Deleted

## 2011-08-15 DIAGNOSIS — R5381 Other malaise: Secondary | ICD-10-CM

## 2011-08-15 DIAGNOSIS — R5383 Other fatigue: Secondary | ICD-10-CM

## 2011-08-15 MED ORDER — CYANOCOBALAMIN 1000 MCG/ML IJ SOLN
1000.0000 ug | Freq: Once | INTRAMUSCULAR | Status: AC
  Administered 2011-08-15: 1000 ug via INTRAMUSCULAR

## 2011-09-07 ENCOUNTER — Ambulatory Visit: Payer: BC Managed Care – PPO

## 2011-09-12 ENCOUNTER — Ambulatory Visit: Payer: BC Managed Care – PPO

## 2011-09-20 ENCOUNTER — Ambulatory Visit (INDEPENDENT_AMBULATORY_CARE_PROVIDER_SITE_OTHER): Payer: BC Managed Care – PPO | Admitting: *Deleted

## 2011-09-20 DIAGNOSIS — E538 Deficiency of other specified B group vitamins: Secondary | ICD-10-CM

## 2011-09-20 MED ORDER — CYANOCOBALAMIN 1000 MCG/ML IJ SOLN
1000.0000 ug | Freq: Once | INTRAMUSCULAR | Status: AC
  Administered 2011-09-20: 1000 ug via INTRAMUSCULAR

## 2011-10-12 ENCOUNTER — Ambulatory Visit: Payer: BC Managed Care – PPO

## 2011-10-17 ENCOUNTER — Ambulatory Visit: Payer: BC Managed Care – PPO

## 2011-10-24 ENCOUNTER — Ambulatory Visit: Payer: BC Managed Care – PPO

## 2011-11-06 ENCOUNTER — Ambulatory Visit: Payer: BC Managed Care – PPO

## 2011-11-06 DIAGNOSIS — R5381 Other malaise: Secondary | ICD-10-CM

## 2011-11-06 DIAGNOSIS — R5383 Other fatigue: Secondary | ICD-10-CM

## 2011-11-06 MED ORDER — CYANOCOBALAMIN 1000 MCG/ML IJ SOLN
1000.0000 ug | Freq: Once | INTRAMUSCULAR | Status: AC
  Administered 2011-11-06: 1000 ug via INTRAMUSCULAR

## 2011-12-05 ENCOUNTER — Ambulatory Visit (INDEPENDENT_AMBULATORY_CARE_PROVIDER_SITE_OTHER): Payer: BC Managed Care – PPO | Admitting: *Deleted

## 2011-12-05 DIAGNOSIS — E538 Deficiency of other specified B group vitamins: Secondary | ICD-10-CM

## 2011-12-05 MED ORDER — CYANOCOBALAMIN 1000 MCG/ML IJ SOLN
1000.0000 ug | Freq: Once | INTRAMUSCULAR | Status: AC
  Administered 2011-12-05: 1000 ug via INTRAMUSCULAR

## 2012-01-09 ENCOUNTER — Ambulatory Visit (INDEPENDENT_AMBULATORY_CARE_PROVIDER_SITE_OTHER): Payer: BC Managed Care – PPO | Admitting: Family Medicine

## 2012-01-09 DIAGNOSIS — E538 Deficiency of other specified B group vitamins: Secondary | ICD-10-CM

## 2012-01-09 MED ORDER — CYANOCOBALAMIN 1000 MCG/ML IJ SOLN
1000.0000 ug | Freq: Once | INTRAMUSCULAR | Status: AC
  Administered 2012-01-09: 1000 ug via INTRAMUSCULAR

## 2012-01-09 NOTE — Progress Notes (Signed)
b12 shot 

## 2012-02-06 ENCOUNTER — Ambulatory Visit (INDEPENDENT_AMBULATORY_CARE_PROVIDER_SITE_OTHER): Payer: BC Managed Care – PPO | Admitting: *Deleted

## 2012-02-06 DIAGNOSIS — E538 Deficiency of other specified B group vitamins: Secondary | ICD-10-CM

## 2012-02-06 MED ORDER — CYANOCOBALAMIN 1000 MCG/ML IJ SOLN
1000.0000 ug | Freq: Once | INTRAMUSCULAR | Status: AC
  Administered 2012-02-06: 1000 ug via INTRAMUSCULAR

## 2012-03-12 ENCOUNTER — Ambulatory Visit (INDEPENDENT_AMBULATORY_CARE_PROVIDER_SITE_OTHER): Payer: BC Managed Care – PPO | Admitting: *Deleted

## 2012-03-12 DIAGNOSIS — E538 Deficiency of other specified B group vitamins: Secondary | ICD-10-CM

## 2012-03-12 MED ORDER — CYANOCOBALAMIN 1000 MCG/ML IJ SOLN
1000.0000 ug | Freq: Once | INTRAMUSCULAR | Status: AC
  Administered 2012-03-12: 1000 ug via INTRAMUSCULAR

## 2012-03-15 ENCOUNTER — Other Ambulatory Visit: Payer: Self-pay | Admitting: Family Medicine

## 2012-03-15 DIAGNOSIS — R5383 Other fatigue: Secondary | ICD-10-CM

## 2012-03-15 DIAGNOSIS — E538 Deficiency of other specified B group vitamins: Secondary | ICD-10-CM

## 2012-03-15 DIAGNOSIS — Z0000 Encounter for general adult medical examination without abnormal findings: Secondary | ICD-10-CM

## 2012-03-19 ENCOUNTER — Other Ambulatory Visit (INDEPENDENT_AMBULATORY_CARE_PROVIDER_SITE_OTHER): Payer: BC Managed Care – PPO

## 2012-03-19 DIAGNOSIS — Z0000 Encounter for general adult medical examination without abnormal findings: Secondary | ICD-10-CM

## 2012-03-19 DIAGNOSIS — R5383 Other fatigue: Secondary | ICD-10-CM

## 2012-03-19 DIAGNOSIS — E538 Deficiency of other specified B group vitamins: Secondary | ICD-10-CM

## 2012-03-19 DIAGNOSIS — R5381 Other malaise: Secondary | ICD-10-CM

## 2012-03-19 LAB — CBC WITH DIFFERENTIAL/PLATELET
Basophils Absolute: 0 10*3/uL (ref 0.0–0.1)
Basophils Relative: 0.6 % (ref 0.0–3.0)
Eosinophils Absolute: 0 10*3/uL (ref 0.0–0.7)
Eosinophils Relative: 0.6 % (ref 0.0–5.0)
HCT: 41.2 % (ref 36.0–46.0)
Hemoglobin: 13.8 g/dL (ref 12.0–15.0)
Lymphocytes Relative: 29.3 % (ref 12.0–46.0)
Lymphs Abs: 1.6 10*3/uL (ref 0.7–4.0)
MCHC: 33.5 g/dL (ref 30.0–36.0)
MCV: 90.3 fl (ref 78.0–100.0)
Monocytes Absolute: 0.5 10*3/uL (ref 0.1–1.0)
Monocytes Relative: 8.9 % (ref 3.0–12.0)
Neutro Abs: 3.2 10*3/uL (ref 1.4–7.7)
Neutrophils Relative %: 60.6 % (ref 43.0–77.0)
Platelets: 204 10*3/uL (ref 150.0–400.0)
RBC: 4.56 Mil/uL (ref 3.87–5.11)
RDW: 13.8 % (ref 11.5–14.6)
WBC: 5.3 10*3/uL (ref 4.5–10.5)

## 2012-03-19 LAB — BASIC METABOLIC PANEL
BUN: 15 mg/dL (ref 6–23)
CO2: 25 mEq/L (ref 19–32)
Calcium: 9 mg/dL (ref 8.4–10.5)
Chloride: 108 mEq/L (ref 96–112)
Creatinine, Ser: 0.6 mg/dL (ref 0.4–1.2)
GFR: 108.53 mL/min (ref >60.00)
Glucose, Bld: 95 mg/dL (ref 70–99)
Potassium: 3.8 mEq/L (ref 3.5–5.1)
Sodium: 141 mEq/L (ref 135–145)

## 2012-03-19 LAB — TSH: TSH: 1.1 u[IU]/mL (ref 0.35–5.50)

## 2012-03-19 LAB — VITAMIN B12: Vitamin B-12: 472 pg/mL (ref 211–911)

## 2012-03-19 LAB — LDL CHOLESTEROL, DIRECT: Direct LDL: 109.3 mg/dL

## 2012-03-26 ENCOUNTER — Encounter: Payer: BC Managed Care – PPO | Admitting: Family Medicine

## 2012-04-02 ENCOUNTER — Ambulatory Visit (INDEPENDENT_AMBULATORY_CARE_PROVIDER_SITE_OTHER): Payer: BC Managed Care – PPO | Admitting: Family Medicine

## 2012-04-02 ENCOUNTER — Encounter: Payer: Self-pay | Admitting: Family Medicine

## 2012-04-02 VITALS — BP 126/82 | HR 84 | Temp 98.2°F | Wt 165.8 lb

## 2012-04-02 DIAGNOSIS — Z0000 Encounter for general adult medical examination without abnormal findings: Secondary | ICD-10-CM

## 2012-04-02 DIAGNOSIS — E538 Deficiency of other specified B group vitamins: Secondary | ICD-10-CM

## 2012-04-02 DIAGNOSIS — Z1211 Encounter for screening for malignant neoplasm of colon: Secondary | ICD-10-CM

## 2012-04-02 MED ORDER — VITAMIN B-12 500 MCG PO TABS: 500.0000 ug | ORAL_TABLET | Freq: Every day | ORAL | Status: DC

## 2012-04-02 NOTE — Patient Instructions (Signed)
Great to see you today, call us with questions. Change from B12 injections to oral supplement - take 500 mcg daily. We will recheck levels next year - if staying low again, we will likely need to remain on injections.

## 2012-04-02 NOTE — Assessment & Plan Note (Signed)
Has completed 1 year of IM shots of B12. Change to oral supplementation. If returns low, will need IM longterm. + fmhx b12 def, ?intrinsic factor def.
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
## 2012-04-02 NOTE — Assessment & Plan Note (Signed)
Reviewed preventative protocols, updated unless pt declined (tetanus) Discussed healthy diet and lifestyle. Reviewed blood work in detail with pt. IFOB today.  Well woman and mammogram through OBGYN per pt preference

## 2012-04-16 ENCOUNTER — Other Ambulatory Visit: Payer: Self-pay

## 2012-04-16 NOTE — Telephone Encounter (Signed)
When seen 04/02/12 pt bought Vitamin b12 500 mg OTC. Pharmacy notified pt Vit B12 500 mg ready for pick up. Pt did not realize was going to be called in but co pay is less expensive by rx than OTC. Pt will pick up rx at pharmacy.

## 2013-03-30 ENCOUNTER — Other Ambulatory Visit: Payer: Self-pay | Admitting: Family Medicine

## 2013-03-31 ENCOUNTER — Other Ambulatory Visit: Payer: Self-pay | Admitting: Family Medicine

## 2013-03-31 DIAGNOSIS — Z0000 Encounter for general adult medical examination without abnormal findings: Secondary | ICD-10-CM

## 2013-03-31 DIAGNOSIS — E538 Deficiency of other specified B group vitamins: Secondary | ICD-10-CM

## 2013-04-02 ENCOUNTER — Other Ambulatory Visit (INDEPENDENT_AMBULATORY_CARE_PROVIDER_SITE_OTHER): Payer: BC Managed Care – PPO

## 2013-04-02 DIAGNOSIS — E538 Deficiency of other specified B group vitamins: Secondary | ICD-10-CM

## 2013-04-02 DIAGNOSIS — Z0000 Encounter for general adult medical examination without abnormal findings: Secondary | ICD-10-CM

## 2013-04-02 DIAGNOSIS — R5381 Other malaise: Secondary | ICD-10-CM

## 2013-04-02 DIAGNOSIS — R5383 Other fatigue: Secondary | ICD-10-CM

## 2013-04-02 LAB — BASIC METABOLIC PANEL
BUN: 19 mg/dL (ref 6–23)
CO2: 28 mEq/L (ref 19–32)
Calcium: 8.9 mg/dL (ref 8.4–10.5)
Chloride: 106 mEq/L (ref 96–112)
Creatinine, Ser: 0.6 mg/dL (ref 0.4–1.2)
GFR: 112.36 mL/min (ref >60.00)
Glucose, Bld: 92 mg/dL (ref 70–99)
Potassium: 4.2 mEq/L (ref 3.5–5.1)
Sodium: 139 mEq/L (ref 135–145)

## 2013-04-02 LAB — LDL CHOLESTEROL, DIRECT: Direct LDL: 120.9 mg/dL

## 2013-04-03 ENCOUNTER — Ambulatory Visit: Payer: BC Managed Care – PPO

## 2013-04-03 DIAGNOSIS — R5381 Other malaise: Secondary | ICD-10-CM

## 2013-04-03 LAB — TSH: TSH: 1.12 u[IU]/mL (ref 0.35–5.50)

## 2013-04-03 LAB — VITAMIN B12: Vitamin B-12: 860 pg/mL (ref 211–911)

## 2013-04-09 ENCOUNTER — Ambulatory Visit (INDEPENDENT_AMBULATORY_CARE_PROVIDER_SITE_OTHER): Payer: BC Managed Care – PPO | Admitting: Family Medicine

## 2013-04-09 ENCOUNTER — Encounter: Payer: Self-pay | Admitting: Family Medicine

## 2013-04-09 VITALS — BP 102/68 | HR 64 | Temp 98.1°F | Ht 62.75 in | Wt 176.5 lb

## 2013-04-09 DIAGNOSIS — Z1211 Encounter for screening for malignant neoplasm of colon: Secondary | ICD-10-CM

## 2013-04-09 DIAGNOSIS — R5381 Other malaise: Secondary | ICD-10-CM

## 2013-04-09 DIAGNOSIS — G43009 Migraine without aura, not intractable, without status migrainosus: Secondary | ICD-10-CM

## 2013-04-09 DIAGNOSIS — E538 Deficiency of other specified B group vitamins: Secondary | ICD-10-CM

## 2013-04-09 DIAGNOSIS — Z0000 Encounter for general adult medical examination without abnormal findings: Secondary | ICD-10-CM

## 2013-04-09 DIAGNOSIS — R5383 Other fatigue: Secondary | ICD-10-CM

## 2013-04-09 NOTE — Assessment & Plan Note (Signed)
Chronic persistent fatigue despite B12 supplementation.  Normal Vit D last check.  Will continue to monitor for now. Consider CBC next blood draw.

## 2013-04-09 NOTE — Assessment & Plan Note (Signed)
Resolved with supplementation 

## 2013-04-09 NOTE — Assessment & Plan Note (Signed)
Followed by [facility].
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
## 2013-04-09 NOTE — Assessment & Plan Note (Signed)
Preventative protocols reviewed and updated unless pt declined. Discussed healthy diet and lifestyle.  Encouraged increased fruits/vegetables, start using home treadmill she has. iFOB today. Recommended schedule well woman with OBGYN.

## 2013-04-09 NOTE — Patient Instructions (Signed)
Let's keep an eye on weight.  Diversify fruits and vegetables in diet. Good to see you today, call us with questions. Pass by lab for stool kit today.

## 2013-08-24 LAB — HM MAMMOGRAPHY

## 2014-02-07 ENCOUNTER — Other Ambulatory Visit: Payer: Self-pay | Admitting: Family Medicine

## 2014-02-07 DIAGNOSIS — Z0000 Encounter for general adult medical examination without abnormal findings: Secondary | ICD-10-CM

## 2014-02-11 ENCOUNTER — Ambulatory Visit (INDEPENDENT_AMBULATORY_CARE_PROVIDER_SITE_OTHER): Payer: BC Managed Care – PPO | Admitting: Family Medicine

## 2014-02-11 ENCOUNTER — Encounter: Payer: Self-pay | Admitting: Family Medicine

## 2014-02-11 ENCOUNTER — Other Ambulatory Visit (INDEPENDENT_AMBULATORY_CARE_PROVIDER_SITE_OTHER): Payer: BC Managed Care – PPO

## 2014-02-11 VITALS — BP 118/74 | HR 77 | Temp 98.4°F | Wt 183.0 lb

## 2014-02-11 DIAGNOSIS — Z0000 Encounter for general adult medical examination without abnormal findings: Secondary | ICD-10-CM

## 2014-02-11 DIAGNOSIS — R0789 Other chest pain: Secondary | ICD-10-CM

## 2014-02-11 LAB — CBC WITH DIFFERENTIAL/PLATELET
Basophils Absolute: 0 10*3/uL (ref 0.0–0.1)
Basophils Relative: 0.6 % (ref 0.0–3.0)
Eosinophils Absolute: 0 10*3/uL (ref 0.0–0.7)
Eosinophils Relative: 0.6 % (ref 0.0–5.0)
HCT: 40.8 % (ref 36.0–46.0)
Hemoglobin: 13.6 g/dL (ref 12.0–15.0)
Lymphocytes Relative: 24.8 % (ref 12.0–46.0)
Lymphs Abs: 1.5 10*3/uL (ref 0.7–4.0)
MCHC: 33.4 g/dL (ref 30.0–36.0)
MCV: 88.8 fl (ref 78.0–100.0)
Monocytes Absolute: 0.4 10*3/uL (ref 0.1–1.0)
Monocytes Relative: 6.6 % (ref 3.0–12.0)
Neutro Abs: 4.1 10*3/uL (ref 1.4–7.7)
Neutrophils Relative %: 67.4 % (ref 43.0–77.0)
Platelets: 215 10*3/uL (ref 150.0–400.0)
RBC: 4.6 Mil/uL (ref 3.87–5.11)
RDW: 13.9 % (ref 11.5–14.6)
WBC: 6 10*3/uL (ref 4.5–10.5)

## 2014-02-11 LAB — BASIC METABOLIC PANEL
BUN: 18 mg/dL (ref 6–23)
CO2: 25 mEq/L (ref 19–32)
Calcium: 8.9 mg/dL (ref 8.4–10.5)
Chloride: 105 mEq/L (ref 96–112)
Creatinine, Ser: 0.7 mg/dL (ref 0.4–1.2)
GFR: 96.72 mL/min (ref >60.00)
Glucose, Bld: 92 mg/dL (ref 70–99)
Potassium: 3.9 mEq/L (ref 3.5–5.1)
Sodium: 138 mEq/L (ref 135–145)

## 2014-02-11 LAB — TSH: TSH: 0.4 u[IU]/mL (ref 0.35–5.50)

## 2014-02-11 LAB — LIPID PANEL
Cholesterol: 216 mg/dL — ABNORMAL HIGH (ref 0–200)
HDL: 74.7 mg/dL (ref >39.00)
LDL Cholesterol: 113 mg/dL — ABNORMAL HIGH (ref 0–99)
Total CHOL/HDL Ratio: 3
Triglycerides: 142 mg/dL (ref 0.0–149.0)
VLDL: 28.4 mg/dL (ref 0.0–40.0)

## 2014-02-11 MED ORDER — OMEPRAZOLE 40 MG PO CPDR
40.0000 mg | DELAYED_RELEASE_CAPSULE | Freq: Every day | ORAL | Status: DC
Start: 2014-02-11 — End: 2015-09-03

## 2014-02-11 NOTE — Addendum Note (Signed)
Addended by: Marchia Bond on: 02/11/2014 10:18 AM   Modules accepted: Orders

## 2014-02-11 NOTE — Assessment & Plan Note (Addendum)
Overall atypical chest pain, doubt cardiac in nature.  However does have family history of CAD. Pt endorses normal stress test 9 yrs ago. She does have h/o GI issues including esophagitis, HH in the past. Will treat presumptively for dyspepsia with 3wk course omeprazole 40mg  daily (sent to pharmacy). Red flags to return or seek urgent care discussed. Reassess at CPE visit later this month.  EKG today - NSR rage 65, normal axis, intervals, no acute ST/T changes. rsR V1

## 2014-02-11 NOTE — Progress Notes (Signed)
BP 118/74  Pulse 77  Temp(Src) 98.4 F (36.9 C) (Oral)  Wt 183 lb (83.008 kg)  SpO2 99%   CC: chest, abd discomfort  Subjective:    Patient ID: Kelly Francis, female    DOB: 04-21-1956, 58 y.o.   MRN: [id number]  HPI: Kelly Francis is a 58 y.o. female presenting on 02/11/2014 for Abdominal Pain and Chest Pressure   Over last 1.5 months having twitching and burning in chest, points to left substernal area.  Now discomfort has radiated to epigastric region.  Some radiation to mid back.  4d ago woke up with dull ache in chest that lasted entire day.  Zantac over last few weeks hasn't help.  Laying down to rest improves discomfort.  Activity worsened discomfort.  Some nausea with discomfort.  No dyspnea with chest discomfort.  No vomiting, constipation, diarrhea, dysphagia.  Appetite ok.  Pt started taking florestor probiotic which didn't.  Denies heartburn, acid reflux, water brash.  Avoids spicy foods.  Known h/o HH.  H/o cholecystectomy.  H/o esophagitis in the past.  This feels different.  No discomfort at night time.    Had blood work today - BMP checked.  + fmhx CAD. States she had stress test done at age 69 and normal.  No records from this. Does work in yard for exercise and does walk for exercise - didn't notice any chest discomfort with this. Wt Readings from Last 3 Encounters:  02/11/14 183 lb (83.008 kg)  04/09/13 176 lb 8 oz (80.06 kg)  04/02/12 165 lb 12 oz (75.184 kg)  Body mass index is 32.67 kg/(m^2).   Relevant past medical, surgical, family and social history reviewed and updated as indicated.  Allergies and medications reviewed and updated. Current Outpatient Prescriptions on File Prior to Visit  Medication Sig  . Aspirin-Acetaminophen-Caffeine (EXCEDRIN PO) Take by mouth daily as needed.  . cetirizine (ZYRTEC) 10 MG tablet Take 10 mg by mouth daily as needed for allergies.  . cloNIDine (CATAPRES) 0.1 MG tablet Take 0.1 mg by mouth daily. For hot flashes   .  CVS VITAMIN B-12 500 MCG tablet TAKE 1 TABLET (500 MCG TOTAL) BY MOUTH DAILY.  Marland Kitchen estradiol (ESTRACE) 1 MG tablet Take 1 mg by mouth daily.    Marland Kitchen topiramate (TOPAMAX) 25 MG tablet Take 25 mg by mouth at bedtime.    Marland Kitchen loratadine (CLARITIN) 10 MG tablet Take 1 tablet (10 mg total) by mouth daily.   No current facility-administered medications on file prior to visit.    Review of Systems Per HPI unless specifically indicated above    Objective:    BP 118/74  Pulse 77  Temp(Src) 98.4 F (36.9 C) (Oral)  Wt 183 lb (83.008 kg)  SpO2 99%  Physical Exam  Nursing note and vitals reviewed. Constitutional: She appears well-developed and well-nourished. No distress.  HENT:  Mouth/Throat: Oropharynx is clear and moist. No oropharyngeal exudate.  Eyes: Conjunctivae and EOM are normal. Pupils are equal, round, and reactive to light.  Cardiovascular: Normal rate, regular rhythm, normal heart sounds and intact distal pulses.   No murmur heard. Pulmonary/Chest: Effort normal and breath sounds normal. No respiratory distress. She has no wheezes. She has no rales. She exhibits no tenderness.  Abdominal: Soft. Bowel sounds are normal. She exhibits no distension and no mass. There is no tenderness. There is no rebound and no guarding.  Musculoskeletal: She exhibits no edema.       Assessment & Plan:   Problem  List Items Addressed This Visit   Chest discomfort - Primary     Overall atypical chest pain, doubt cardiac in nature.  However does have family history of CAD. Pt endorses normal stress test 9 yrs ago. She does have h/o GI issues including esophagitis, HH in the past. Will treat presumptively for dyspepsia with 3wk course omeprazole 40mg  daily (sent to pharmacy). Red flags to return or seek urgent care discussed. Reassess at CPE visit later this month.  EKG today - NSR rage 65, normal axis, intervals, no acute ST/T changes. rsR V1    Relevant Orders      EKG 12-Lead (Completed)        Follow up plan: Return if symptoms worsen or fail to improve, for otherwise keep prior appointment.

## 2014-02-11 NOTE — Patient Instructions (Addendum)
I am more suspicious for GI issue causing these symptoms.  Start omeprazole 40mg  daily for next 3 weeks. We will recheck symptoms at physical. Let me know sooner if any worsening symptoms of chest discomfort or any shortness of breath or just not improving with omeprazole. EKG today looking ok. I have added labs to blood work drawn today.

## 2014-02-11 NOTE — Progress Notes (Signed)
Pre visit review using our clinic review tool, if applicable. No additional management support is needed unless otherwise documented below in the visit note. 

## 2014-02-12 ENCOUNTER — Encounter: Payer: Self-pay | Admitting: Family Medicine

## 2014-03-12 ENCOUNTER — Ambulatory Visit (INDEPENDENT_AMBULATORY_CARE_PROVIDER_SITE_OTHER): Payer: BC Managed Care – PPO | Admitting: Family Medicine

## 2014-03-12 ENCOUNTER — Encounter: Payer: Self-pay | Admitting: Family Medicine

## 2014-03-12 ENCOUNTER — Other Ambulatory Visit: Payer: Self-pay | Admitting: *Deleted

## 2014-03-12 VITALS — BP 118/86 | HR 80 | Temp 97.9°F | Ht 62.5 in | Wt 184.8 lb

## 2014-03-12 DIAGNOSIS — M79672 Pain in left foot: Secondary | ICD-10-CM | POA: Insufficient documentation

## 2014-03-12 DIAGNOSIS — E785 Hyperlipidemia, unspecified: Secondary | ICD-10-CM | POA: Insufficient documentation

## 2014-03-12 DIAGNOSIS — M79671 Pain in right foot: Secondary | ICD-10-CM | POA: Insufficient documentation

## 2014-03-12 DIAGNOSIS — M722 Plantar fascial fibromatosis: Secondary | ICD-10-CM

## 2014-03-12 DIAGNOSIS — Z0000 Encounter for general adult medical examination without abnormal findings: Secondary | ICD-10-CM

## 2014-03-12 MED ORDER — CYANOCOBALAMIN 500 MCG PO TABS: ORAL_TABLET | ORAL | Status: DC

## 2014-03-12 NOTE — Patient Instructions (Addendum)
For plantar fasciitis - buy heel cushion to use, do frozen water bottle massage and do stretching exercises provided today.  Avoid heels. May use ibuprofen as needed. Next time you're there, ask [facility] to send me a copy of your mammogram. Start omeprazole every other day to see if symptoms stay well controlled. Good to see you today, call us with questions.

## 2014-03-12 NOTE — Assessment & Plan Note (Signed)
Preventative protocols reviewed and updated unless pt declined. Discussed healthy diet and lifestyle.  
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
## 2014-03-12 NOTE — Assessment & Plan Note (Signed)
Anticipate plantar fasciitis - discussed treatment recommendations as per pt instructions. Handout provided from [facility] pt advisor with stretching exercises.

## 2014-03-12 NOTE — Progress Notes (Signed)
Pre visit review using our clinic review tool, if applicable. No additional management support is needed unless otherwise documented below in the visit note. 

## 2014-03-12 NOTE — Assessment & Plan Note (Signed)
Mild. Discussed dietary choices to improve lipid levels.

## 2014-03-13 ENCOUNTER — Other Ambulatory Visit: Payer: BC Managed Care – PPO

## 2014-03-13 DIAGNOSIS — Z1211 Encounter for screening for malignant neoplasm of colon: Secondary | ICD-10-CM

## 2014-03-13 LAB — FECAL OCCULT BLOOD, GUAIAC: Fecal Occult Blood: NEGATIVE

## 2014-03-13 LAB — FECAL OCCULT BLOOD, IMMUNOCHEMICAL: Fecal Occult Bld: NEGATIVE

## 2014-03-16 ENCOUNTER — Encounter: Payer: Self-pay | Admitting: *Deleted

## 2014-06-24 ENCOUNTER — Ambulatory Visit: Payer: Self-pay | Admitting: Physician Assistant

## 2014-06-25 ENCOUNTER — Telehealth: Payer: Self-pay | Admitting: Family Medicine

## 2014-06-25 MED ORDER — EPINEPHRINE 0.3 MG/0.3ML IJ SOAJ: 0.3000 mg | Freq: Once | INTRAMUSCULAR | Status: DC

## 2014-06-25 NOTE — Telephone Encounter (Signed)
Patient notified and verbalized understanding. 

## 2014-06-25 NOTE — Telephone Encounter (Signed)
Conna/patient Phone [phone]; Called to request Epi-Pen be sent to [facility].  Stung by yellow jacket on right thigh 06/22/14.  Treated at [facility] in [facility] 06/24/14 after waking up with increasing pain, worsening redness and significant edema from groin to knee.  Treated with steroid injection, oral steroids, antihistamines, generic Bactrim DS, Ibuprofen, and Zantac.  RPh suggested she ask PCP for Epi-Pen due to the significant reaction [place]  Last office visit 03/12/14.  Symptoms are improving now with lessening edema and redness. Please advise if Rx ordered.

## 2014-06-25 NOTE — Telephone Encounter (Signed)
plz notify rx sent to pharmacy. If uses epi pen will also need to be evaluated urgently.

## 2014-07-30 ENCOUNTER — Other Ambulatory Visit: Payer: Self-pay | Admitting: Family Medicine

## 2014-08-25 ENCOUNTER — Encounter: Payer: Self-pay | Admitting: Family Medicine

## 2014-08-25 ENCOUNTER — Ambulatory Visit (INDEPENDENT_AMBULATORY_CARE_PROVIDER_SITE_OTHER)
Admission: RE | Admit: 2014-08-25 | Discharge: 2014-08-25 | Disposition: A | Discharge status: Home / Self Care | Payer: BC Managed Care – PPO | Source: Ambulatory Visit | Attending: Family Medicine | Admitting: Family Medicine

## 2014-08-25 ENCOUNTER — Ambulatory Visit (INDEPENDENT_AMBULATORY_CARE_PROVIDER_SITE_OTHER): Payer: BC Managed Care – PPO | Admitting: Family Medicine

## 2014-08-25 VITALS — BP 126/82 | HR 88 | Temp 98.7°F | Wt 185.0 lb

## 2014-08-25 DIAGNOSIS — R079 Chest pain, unspecified: Secondary | ICD-10-CM

## 2014-08-25 DIAGNOSIS — R072 Precordial pain: Secondary | ICD-10-CM | POA: Insufficient documentation

## 2014-08-25 DIAGNOSIS — R0789 Other chest pain: Secondary | ICD-10-CM

## 2014-08-25 NOTE — Progress Notes (Addendum)
BP 126/82  Pulse 88  Temp(Src) 98.7 F (37.1 C) (Oral)  Wt 185 lb (83.915 kg)   CC: chest pain episode  Subjective:    Patient ID: Kelly Francis, female    DOB: October 10, 1956, 58 y.o.   MRN: [id number]  HPI: Kelly Francis is a 58 y.o. female presenting on 08/25/2014 for Chest Pain   3d ago at 9:30am episode of epigastric squeezing pain associated with dyspnea that progressively worsened as she was walking on her deck. Pain lasted 10-15 min, then suddenly resolved. + diaphoresis, fatigue, mild nausea. No radiation to back, no tearing pain. She has been having some shoulder blade pain for last 2 weeks.  Took that day off to do house work. Previous night with GERD sxs. That morning took omeprazole 40mg , then ate boiled egg and bacon. She also took ibuprofen, b12 and estrogen that morning.  She had not been taking omeprazole 40mg . Since this episode has restarted.   States she had stress test done at age 73 and normal. No records from this.  + fmhx CAD.  Denies prolonged immobility such as recent log car ride or plane ride. She is on estrogen however.  Relevant past medical, surgical, family and social history reviewed and updated as indicated.  Allergies and medications reviewed and updated. Current Outpatient Prescriptions on File Prior to Visit  Medication Sig  . Aspirin-Acetaminophen-Caffeine (EXCEDRIN PO) Take by mouth daily as needed.  . cloNIDine (CATAPRES) 0.1 MG tablet Take 0.1 mg by mouth daily. For hot flashes   . CVS VITAMIN B-12 500 MCG tablet TAKE 1 TABLET BY MOUTH EVERY DAY  . estradiol (ESTRACE) 1 MG tablet Take 1 mg by mouth daily.    Marland Kitchen omeprazole (PRILOSEC) 40 MG capsule Take 1 capsule (40 mg total) by mouth daily. 30 min before breakfast.  . topiramate (TOPAMAX) 25 MG tablet Take 25 mg by mouth at bedtime.    . cetirizine (ZYRTEC) 10 MG tablet Take 10 mg by mouth daily as needed for allergies.  Marland Kitchen EPINEPHrine (EPIPEN) 0.3 mg/0.3 mL IJ SOAJ injection Inject 0.3 mLs (0.3  mg total) into the muscle once.  . loratadine (CLARITIN) 10 MG tablet Take 1 tablet (10 mg total) by mouth daily.   No current facility-administered medications on file prior to visit.   Past Medical History  Diagnosis Date  . Glaucoma     very mild  . Kidney stones   . Migraine     frequent, Dr. [facility]  . History of kidney stones   . Vitamin B12 deficiency 02/2011    s/p 1 year injections, now oral replacement  . Dyslipidemia    Past Surgical History  Procedure Laterality Date  . Exploratory laparotomy  1998    Removed ovarian cysts and fibroids  . Vaginal hysterectomy  1999    ovaries remain  . Cholecystectomy  1994  . Vein ligation and stripping  2012    Dr. Hulda Humphrey   Family History  Problem Relation Age of Onset  . Lung cancer Paternal Uncle   . Heart disease Other     strong family hx (cousin dec 49, PGF dec 66)  . Prostate cancer Paternal Grandfather   . Heart attack Paternal Grandfather 64    deceased, also strong family hx CAD/MI  . Hyperlipidemia    . Atrial fibrillation Brother     with MVP and PVCs  . Heart attack Cousin 29    sudden death  . Diabetes Mother   .  Heart attack Paternal Uncle   . Melanoma Sister   . Crohn's disease Sister   . Thyroid disease Mother   . Thyroid disease Sister     Review of Systems Per HPI unless specifically indicated above    Objective:    BP 126/82  Pulse 88  Temp(Src) 98.7 F (37.1 C) (Oral)  Wt 185 lb (83.915 kg)  Physical Exam  Nursing note and vitals reviewed. Constitutional: She appears well-developed and well-nourished. No distress.  HENT:  Mouth/Throat: Oropharynx is clear and moist. No oropharyngeal exudate.  Cardiovascular: Normal rate, regular rhythm, normal heart sounds and intact distal pulses.   No murmur heard. Pulmonary/Chest: Effort normal and breath sounds normal. No respiratory distress. She has no wheezes. She has no rales.  Abdominal: Soft. Normal appearance and bowel  sounds are normal. She exhibits no distension and no mass. There is no hepatosplenomegaly. There is no tenderness. There is no rigidity, no rebound, no guarding, no CVA tenderness and negative Murphy's sign.  Musculoskeletal: She exhibits no edema.  No palp cords  Skin: Skin is warm and dry. No rash noted.  Psychiatric: She has a normal mood and affect.       Assessment & Plan:   Problem List Items Addressed This Visit   Chest pain - Primary     Cardiovascular risk factors include estrogen use and fmhx CAD. Overall atypical pain, but I do recommend cardiology evaluation - referral placed today and we will call tomorrow with appointment. EKG today overall stable.  Check CXR today to r/o dissection - ?cardiomegaly, will await rad read. No CHF sxs. Check CMP, CBC, lipase, TnI and D dimer today.  EKG - NSR rate 75, normal axis, intervals, no acute ST/T changes    Relevant Orders      EKG 12-Lead (Completed)      DG Chest 2 View      Comprehensive metabolic panel      CBC with Differential      Lipase      D-dimer, quantitative      Troponin I      Ambulatory referral to Cardiology    Other Visit Diagnoses   Other chest pain        Relevant Orders       EKG 12-Lead (Completed)        Follow up plan: Return if symptoms worsen or fail to improve.

## 2014-08-25 NOTE — Patient Instructions (Addendum)
Blood work today Chest xray today. I'd like to refer you to heart doctor for further evaluation - we will call you to schedule this tomorrow. Continue omeprazole 40mg  daily. Good to see you today, call us with questions.

## 2014-08-25 NOTE — Progress Notes (Signed)
Pre visit review using our clinic review tool, if applicable. No additional management support is needed unless otherwise documented below in the visit note. 

## 2014-08-25 NOTE — Assessment & Plan Note (Addendum)
Cardiovascular risk factors include estrogen use and fmhx CAD. Overall atypical pain, but I do recommend cardiology evaluation - referral placed today and we will call tomorrow with appointment. EKG today overall stable.  Check CXR today to r/o dissection - ?cardiomegaly, will await rad read. No CHF sxs. Check CMP, CBC, lipase, TnI and D dimer today.  EKG - NSR rate 75, normal axis, intervals, no acute ST/T changes

## 2014-08-26 ENCOUNTER — Encounter: Payer: Self-pay | Admitting: Cardiovascular Disease

## 2014-08-26 ENCOUNTER — Ambulatory Visit (INDEPENDENT_AMBULATORY_CARE_PROVIDER_SITE_OTHER): Payer: BC Managed Care – PPO | Admitting: Cardiovascular Disease

## 2014-08-26 VITALS — BP 122/90 | HR 68 | Ht 63.5 in | Wt 184.2 lb

## 2014-08-26 DIAGNOSIS — E785 Hyperlipidemia, unspecified: Secondary | ICD-10-CM

## 2014-08-26 DIAGNOSIS — R0602 Shortness of breath: Secondary | ICD-10-CM

## 2014-08-26 DIAGNOSIS — R079 Chest pain, unspecified: Secondary | ICD-10-CM

## 2014-08-26 DIAGNOSIS — R0789 Other chest pain: Secondary | ICD-10-CM

## 2014-08-26 DIAGNOSIS — E66811 Obesity, class 1: Secondary | ICD-10-CM | POA: Insufficient documentation

## 2014-08-26 DIAGNOSIS — E669 Obesity, unspecified: Secondary | ICD-10-CM | POA: Insufficient documentation

## 2014-08-26 DIAGNOSIS — Z6832 Body mass index (BMI) 32.0-32.9, adult: Secondary | ICD-10-CM | POA: Insufficient documentation

## 2014-08-26 LAB — CBC WITH DIFFERENTIAL/PLATELET
Basophils Absolute: 0 10*3/uL (ref 0.0–0.1)
Basophils Relative: 0.5 % (ref 0.0–3.0)
Eosinophils Absolute: 0 10*3/uL (ref 0.0–0.7)
Eosinophils Relative: 0.4 % (ref 0.0–5.0)
HCT: 39.1 % (ref 36.0–46.0)
Hemoglobin: 12.9 g/dL (ref 12.0–15.0)
Lymphocytes Relative: 29.1 % (ref 12.0–46.0)
Lymphs Abs: 1.9 10*3/uL (ref 0.7–4.0)
MCHC: 33 g/dL (ref 30.0–36.0)
MCV: 89 fl (ref 78.0–100.0)
Monocytes Absolute: 0.3 10*3/uL (ref 0.1–1.0)
Monocytes Relative: 5.2 % (ref 3.0–12.0)
Neutro Abs: 4.3 10*3/uL (ref 1.4–7.7)
Neutrophils Relative %: 64.8 % (ref 43.0–77.0)
Platelets: 214 10*3/uL (ref 150.0–400.0)
RBC: 4.4 Mil/uL (ref 3.87–5.11)
RDW: 14.2 % (ref 11.5–15.5)
WBC: 6.6 10*3/uL (ref 4.0–10.5)

## 2014-08-26 LAB — COMPREHENSIVE METABOLIC PANEL
ALT: 15 U/L (ref 0–35)
AST: 19 U/L (ref 0–37)
Albumin: 3.5 g/dL (ref 3.5–5.2)
Alkaline Phosphatase: 80 U/L (ref 39–117)
BUN: 16 mg/dL (ref 6–23)
CO2: 25 mEq/L (ref 19–32)
Calcium: 8.9 mg/dL (ref 8.4–10.5)
Chloride: 105 mEq/L (ref 96–112)
Creatinine, Ser: 0.6 mg/dL (ref 0.4–1.2)
GFR: 114.02 mL/min (ref >60.00)
Glucose, Bld: 88 mg/dL (ref 70–99)
Potassium: 3.5 mEq/L (ref 3.5–5.1)
Sodium: 137 mEq/L (ref 135–145)
Total Bilirubin: 0.3 mg/dL (ref 0.2–1.2)
Total Protein: 7.3 g/dL (ref 6.0–8.3)

## 2014-08-26 LAB — TROPONIN I: Troponin I: 0.01 ng/mL (ref <0.06)

## 2014-08-26 LAB — LIPASE: Lipase: 24 U/L (ref 11.0–59.0)

## 2014-08-26 LAB — D-DIMER, QUANTITATIVE (NOT AT ARMC): D-Dimer, Quant: 0.51 ug/mL-FEU — ABNORMAL HIGH (ref 0.00–0.48)

## 2014-08-26 NOTE — Progress Notes (Signed)
Patient ID: Kelly Francis, female    DOB: [birth year], 58 y.o.   MRN: [id number]  HPI Comments: Kelly Francis is a pleasant 58 year old woman with obesity, hypertension, GERD, anxiety who presents for evaluation after episode of upper epigastric pain. Recently seen by Dr. Danise Mina  She reports that last Friday, 08/21/2014 in the morning she had acute onset of upper epigastric pain. Symptoms lasted for quite some time, described as 10 over 10 associated with diaphoresis. Symptoms came on at rest. She has not had symptoms like this before and has not had any symptoms since that time. She had difficulty walking and talking in the setting of her pain. She did eat breakfast that morning Symptoms resolved on her own without any intervention. She denies any belching. Position did not seem to affect her pain.  She reports having worsening upper chest symptoms and is uncertain if this is from GERD or not. She takes her omeprazole daily. Has lots of symptoms typically at rest, sometimes with exertion. "I can't tell what it is"  No regular exercise. Seen previously for swelling of her pains in the hands and feet and March 2012 Notes indicate a history of migraines  EKG shows normal sinus rhythm with rate 68 beats per minute, no significant ST or T wave changes   Outpatient Encounter Prescriptions as of 08/26/2014  Medication Sig  . Aspirin-Acetaminophen-Caffeine (EXCEDRIN PO) Take by mouth daily as needed.  . cetirizine (ZYRTEC) 10 MG tablet Take 10 mg by mouth daily as needed for allergies.  . cloNIDine (CATAPRES) 0.1 MG tablet Take 0.1 mg by mouth daily. For hot flashes   . CVS VITAMIN B-12 500 MCG tablet TAKE 1 TABLET BY MOUTH EVERY DAY  . EPINEPHrine (EPIPEN) 0.3 mg/0.3 mL IJ SOAJ injection Inject 0.3 mLs (0.3 mg total) into the muscle once.  Marland Kitchen estradiol (ESTRACE) 1 MG tablet Take 1 mg by mouth daily.    Marland Kitchen omeprazole (PRILOSEC) 40 MG capsule Take 1 capsule (40 mg total) by mouth daily. 30 min before  breakfast.  . topiramate (TOPAMAX) 25 MG tablet Take 25 mg by mouth at bedtime.      Review of Systems  Constitutional: Negative.   HENT: Negative.   Eyes: Negative.   Respiratory: Negative.   Cardiovascular: Positive for chest pain.  Gastrointestinal: Negative.   Endocrine: Negative.   Musculoskeletal: Negative.   Skin: Negative.   Allergic/Immunologic: Negative.   Neurological: Negative.   Hematological: Negative.   Psychiatric/Behavioral: Negative.   All other systems reviewed and are negative.   BP 122/90  Pulse 68  Ht 5' 3.5" (1.613 m)  Wt 184 lb 4 oz (83.575 kg)  BMI 32.12 kg/m2  Physical Exam  Nursing note and vitals reviewed. Constitutional: She is oriented to person, place, and time. She appears well-developed and well-nourished.  HENT:  Head: Normocephalic.  Nose: Nose normal.  Mouth/Throat: Oropharynx is clear and moist.  Eyes: Conjunctivae are normal. Pupils are equal, round, and reactive to light.  Neck: Normal range of motion. Neck supple. No JVD present.  Cardiovascular: Normal rate, regular rhythm, S1 normal, S2 normal, normal heart sounds and intact distal pulses.  Exam reveals no gallop and no friction rub.   No murmur heard. Pulmonary/Chest: Effort normal and breath sounds normal. No respiratory distress. She has no wheezes. She has no rales. She exhibits no tenderness.  Abdominal: Soft. Bowel sounds are normal. She exhibits no distension. There is no tenderness.  Musculoskeletal: Normal range of motion. She exhibits no edema and  no tenderness.  Lymphadenopathy:    She has no cervical adenopathy.  Neurological: She is alert and oriented to person, place, and time. Coordination normal.  Skin: Skin is warm and dry. No rash noted. No erythema.  Psychiatric: She has a normal mood and affect. Her behavior is normal. Judgment and thought content normal.    Assessment and Plan

## 2014-08-26 NOTE — Patient Instructions (Addendum)
Your next appointment will be scheduled in our new office located at :  [address], [place], [place] [address]   We have ordered a treadmill stress test for recent chest pain We will schedule this for Friday at 4:15 pm  No medication changes were made.  Please call us if you have new issues that need to be addressed before your next appt.

## 2014-08-26 NOTE — Assessment & Plan Note (Signed)
Cholesterol is at goal on the current lipid regimen. No changes to the medications were made.  

## 2014-08-26 NOTE — Assessment & Plan Note (Addendum)
Upper epigastric pain 5 days ago of uncertain etiology. Atypical in nature, coming on at rest. No further symptoms since that time. Normal EKG and clinical exam. Possible GI-related issue. We have discussed the various treatment options with her including watchful waiting, and stress testing. After long discussion, we ordered a routine treadmill study. She continues to have vague epigastric and chest discomfort coming on at rest.Test will be scheduled for this Friday  If she has additional episodes, further testing could be done.

## 2014-08-26 NOTE — Assessment & Plan Note (Signed)
Recommended a strict diet, regular walking program. She is relatively sedentary

## 2014-08-27 ENCOUNTER — Telehealth: Payer: Self-pay | Admitting: *Deleted

## 2014-08-27 NOTE — Telephone Encounter (Signed)
Noted. Thanks.

## 2014-08-27 NOTE — Telephone Encounter (Signed)
Patient wanted you to be aware that Dr. Rockey Situ said that if stress test came back clear tomorrow-he said that it could be her hiatal hernia pushing against her esophagus. She said she may need a stronger med for her GERD or an endoscopy.

## 2014-08-28 ENCOUNTER — Ambulatory Visit (INDEPENDENT_AMBULATORY_CARE_PROVIDER_SITE_OTHER): Payer: BC Managed Care – PPO | Admitting: Cardiovascular Disease

## 2014-08-28 ENCOUNTER — Encounter: Payer: BC Managed Care – PPO | Admitting: Cardiovascular Disease

## 2014-08-28 DIAGNOSIS — R079 Chest pain, unspecified: Secondary | ICD-10-CM

## 2014-08-28 DIAGNOSIS — R0602 Shortness of breath: Secondary | ICD-10-CM

## 2014-08-28 MED ORDER — NITROGLYCERIN 0.4 MG SL SUBL: 0.4000 mg | SUBLINGUAL_TABLET | SUBLINGUAL | Status: AC | PRN

## 2014-08-28 NOTE — Procedures (Signed)
Treadmill ordered for recent epsiodes of chest pain.  Resting EKG shows NSR with rate of 82 bpm, no significant ST or T wave changes Resting blood pressure of 115/88 Stand bruce protocal was used.  Patient exercised for 9 min  Peak heart rate of 166 bpm.  This was 101% of the maximum predicted heart rate, 164 beats per minute Achieved 10.1 METS No symptoms of chest pain or lightheadedness were reported at peak stress or in recovery.  Peak Blood pressure recorded was 210/115 Heart rate at 3 minutes in recovery was 120 bpm.  FINAL IMPRESSION: Normal exercise stress test. No significant EKG changes concerning for ischemia. Good exercise tolerance. Accelerated heart rate and blood pressure with minimal exertion, slow recovery of blood pressure. Blood pressure remained 198/84 at 7 minutes in recovery Recommended a regular walking/exercise program Etiology of her chest pain episode several weeks ago is likely noncardiac

## 2014-08-28 NOTE — Patient Instructions (Signed)
Your stress test was normal. Congratulations! OK to use your treadmill without restrictions.  We will call in nitro SL if you have more acute epigastric/chest pain Please call if you have more epsiodes

## 2014-08-31 ENCOUNTER — Telehealth: Payer: Self-pay | Admitting: Family Medicine

## 2014-08-31 NOTE — Telephone Encounter (Addendum)
Patient notified and verbalized understanding. Patient asked how many Omeprazole she could take on a daily basis if she felt she needed more than one. She said she still has some minor uncomfortable feelings and has been taking a OTC zantac in the afternoon which helps her feel better. She wanted to make sure that was okay or if she could take another omeprazole. I advised to continue the zantac PRN and call if it no longer worked. She agreed.

## 2014-08-31 NOTE — Telephone Encounter (Signed)
plz notify I received notice that stress test was normal. That is great. I want to make sure she stays on her omeprazole 40mg  daily. If persistent symptoms despite this to let us know for different medication.

## 2015-04-22 ENCOUNTER — Encounter: Payer: Self-pay | Admitting: Family Medicine

## 2015-04-22 ENCOUNTER — Ambulatory Visit (INDEPENDENT_AMBULATORY_CARE_PROVIDER_SITE_OTHER): Payer: BLUE CROSS/BLUE SHIELD | Admitting: Family Medicine

## 2015-04-22 VITALS — BP 122/66 | HR 68 | Temp 98.1°F | Ht 62.5 in | Wt 186.8 lb

## 2015-04-22 DIAGNOSIS — E669 Obesity, unspecified: Secondary | ICD-10-CM

## 2015-04-22 DIAGNOSIS — M7710 Lateral epicondylitis, unspecified elbow: Secondary | ICD-10-CM | POA: Insufficient documentation

## 2015-04-22 DIAGNOSIS — G43009 Migraine without aura, not intractable, without status migrainosus: Secondary | ICD-10-CM

## 2015-04-22 DIAGNOSIS — Z1211 Encounter for screening for malignant neoplasm of colon: Secondary | ICD-10-CM

## 2015-04-22 DIAGNOSIS — E785 Hyperlipidemia, unspecified: Secondary | ICD-10-CM | POA: Diagnosis not present

## 2015-04-22 DIAGNOSIS — M7712 Lateral epicondylitis, left elbow: Secondary | ICD-10-CM

## 2015-04-22 DIAGNOSIS — M79672 Pain in left foot: Secondary | ICD-10-CM | POA: Diagnosis not present

## 2015-04-22 DIAGNOSIS — Z0000 Encounter for general adult medical examination without abnormal findings: Secondary | ICD-10-CM

## 2015-04-22 NOTE — Progress Notes (Signed)
Pre visit review using our clinic review tool, if applicable. No additional management support is needed unless otherwise documented below in the visit note. 

## 2015-04-22 NOTE — Patient Instructions (Addendum)
Pass by lab to pick up stool kit. Blood work today. We will refer you to foot doctor for further evaluation of left heel pain For tennis elbow - use strap provided today and do exercises.  Ok to continue anti inflammatory - but take with food to avoid stomach upset.

## 2015-04-22 NOTE — Assessment & Plan Note (Signed)
Followed by [facility] - seems to be stable on current regimen of excedrin, clonidine and topamax.

## 2015-04-22 NOTE — Assessment & Plan Note (Signed)
Discussed healthy diet and lifestyle changes to affect sustainable weight loss  

## 2015-04-22 NOTE — Assessment & Plan Note (Signed)
Check FLP today. 

## 2015-04-22 NOTE — Assessment & Plan Note (Addendum)
Ongoing pain, activity limiting.  No tenderness elicited on exam today. Unclear if truly plantar fasciitis vs other etiology ?spur. Will refer to podiatry for further evaluation given prolonged nature of process and per patient request.

## 2015-04-22 NOTE — Assessment & Plan Note (Signed)
Preventative protocols reviewed and updated unless pt declined. Discussed healthy diet and lifestyle.  
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
## 2015-04-22 NOTE — Assessment & Plan Note (Signed)
Discussed dx. Placed in tennis elbow strap. Discussed stretching exercises as well as NSAID use, but emphasized importance of avoiding repetitive movements with forearm to ensure resolution.

## 2015-04-23 LAB — LIPID PANEL
Cholesterol: 214 mg/dL — ABNORMAL HIGH (ref 0–200)
HDL: 75.3 mg/dL (ref >39.00)
LDL Cholesterol: 109 mg/dL — ABNORMAL HIGH (ref 0–99)
NonHDL: 138.7
Total CHOL/HDL Ratio: 3
Triglycerides: 149 mg/dL (ref 0.0–149.0)
VLDL: 29.8 mg/dL (ref 0.0–40.0)

## 2015-04-23 LAB — TSH: TSH: 1.11 u[IU]/mL (ref 0.35–4.50)

## 2015-04-23 LAB — BASIC METABOLIC PANEL
BUN: 20 mg/dL (ref 6–23)
CO2: 25 mEq/L (ref 19–32)
Calcium: 9.2 mg/dL (ref 8.4–10.5)
Chloride: 103 mEq/L (ref 96–112)
Creatinine, Ser: 0.63 mg/dL (ref 0.40–1.20)
GFR: 103.4 mL/min (ref >60.00)
Glucose, Bld: 90 mg/dL (ref 70–99)
Potassium: 4 mEq/L (ref 3.5–5.1)
Sodium: 137 mEq/L (ref 135–145)

## 2015-04-26 ENCOUNTER — Encounter: Payer: Self-pay | Admitting: Family Medicine

## 2015-04-26 ENCOUNTER — Encounter: Payer: Self-pay | Admitting: *Deleted

## 2015-05-12 LAB — FECAL OCCULT BLOOD, GUAIAC: Fecal Occult Blood: NEGATIVE

## 2015-05-13 ENCOUNTER — Other Ambulatory Visit: Payer: BLUE CROSS/BLUE SHIELD

## 2015-05-13 ENCOUNTER — Encounter: Payer: Self-pay | Admitting: *Deleted

## 2015-05-13 DIAGNOSIS — Z1211 Encounter for screening for malignant neoplasm of colon: Secondary | ICD-10-CM

## 2015-05-13 LAB — FECAL OCCULT BLOOD, IMMUNOCHEMICAL: Fecal Occult Bld: NEGATIVE

## 2015-05-13 LAB — FECAL OCCULT BLOOD, GUAIAC: Fecal Occult Blood: NEGATIVE

## 2015-06-04 ENCOUNTER — Encounter: Payer: Self-pay | Admitting: *Deleted

## 2015-09-03 ENCOUNTER — Other Ambulatory Visit: Payer: Self-pay | Admitting: Family Medicine

## 2015-09-19 ENCOUNTER — Other Ambulatory Visit: Payer: Self-pay | Admitting: Family Medicine

## 2015-09-22 NOTE — Progress Notes (Signed)
This encounter was created in error - please disregard.

## 2015-11-18 ENCOUNTER — Encounter: Payer: Self-pay | Admitting: Cardiology

## 2015-11-26 ENCOUNTER — Encounter: Payer: Self-pay | Admitting: Gastroenterology

## 2016-01-12 DIAGNOSIS — G454 Transient global amnesia: Secondary | ICD-10-CM

## 2016-01-12 HISTORY — DX: Transient global amnesia: G45.4

## 2016-01-18 ENCOUNTER — Other Ambulatory Visit: Payer: Self-pay | Admitting: Specialist

## 2016-01-18 DIAGNOSIS — R413 Other amnesia: Secondary | ICD-10-CM

## 2016-01-18 DIAGNOSIS — H547 Unspecified visual loss: Secondary | ICD-10-CM

## 2016-01-26 ENCOUNTER — Ambulatory Visit
Admission: RE | Admit: 2016-01-26 | Discharge: 2016-01-26 | Disposition: A | Discharge status: Home / Self Care | Payer: 59 | Source: Ambulatory Visit | Attending: Specialist | Admitting: Specialist

## 2016-01-26 DIAGNOSIS — R413 Other amnesia: Secondary | ICD-10-CM

## 2016-01-26 DIAGNOSIS — H547 Unspecified visual loss: Secondary | ICD-10-CM

## 2016-01-26 MED ORDER — GADOBENATE DIMEGLUMINE 529 MG/ML IV SOLN
17.0000 mL | Freq: Once | INTRAVENOUS | Status: AC | PRN
  Administered 2016-01-26: 17 mL via INTRAVENOUS

## 2016-04-28 ENCOUNTER — Ambulatory Visit (INDEPENDENT_AMBULATORY_CARE_PROVIDER_SITE_OTHER): Payer: 59 | Admitting: Family Medicine

## 2016-04-28 ENCOUNTER — Encounter: Payer: Self-pay | Admitting: Family Medicine

## 2016-04-28 VITALS — BP 130/80 | HR 84 | Temp 98.1°F | Ht 62.5 in | Wt 193.0 lb

## 2016-04-28 DIAGNOSIS — E669 Obesity, unspecified: Secondary | ICD-10-CM

## 2016-04-28 DIAGNOSIS — M79671 Pain in right foot: Secondary | ICD-10-CM

## 2016-04-28 DIAGNOSIS — M79672 Pain in left foot: Secondary | ICD-10-CM | POA: Diagnosis not present

## 2016-04-28 DIAGNOSIS — H409 Unspecified glaucoma: Secondary | ICD-10-CM | POA: Diagnosis not present

## 2016-04-28 DIAGNOSIS — E785 Hyperlipidemia, unspecified: Secondary | ICD-10-CM

## 2016-04-28 DIAGNOSIS — Z0000 Encounter for general adult medical examination without abnormal findings: Secondary | ICD-10-CM | POA: Diagnosis not present

## 2016-04-28 DIAGNOSIS — G43009 Migraine without aura, not intractable, without status migrainosus: Secondary | ICD-10-CM

## 2016-04-28 LAB — LIPID PANEL
Cholesterol: 196 mg/dL (ref 125–200)
HDL: 83 mg/dL (ref >=46)
LDL Cholesterol: 85 mg/dL (ref <130)
Total CHOL/HDL Ratio: 2.4 Ratio (ref <=5.0)
Triglycerides: 140 mg/dL (ref <150)
VLDL: 28 mg/dL (ref <30)

## 2016-04-28 LAB — BASIC METABOLIC PANEL
BUN: 20 mg/dL (ref 7–25)
CO2: 21 mmol/L (ref 20–31)
Calcium: 9 mg/dL (ref 8.6–10.4)
Chloride: 105 mmol/L (ref 98–110)
Creat: 0.61 mg/dL (ref 0.50–1.05)
Glucose, Bld: 93 mg/dL (ref 65–99)
Potassium: 3.9 mmol/L (ref 3.5–5.3)
Sodium: 139 mmol/L (ref 135–146)

## 2016-04-28 LAB — TSH: TSH: 1.89 mIU/L

## 2016-04-28 MED ORDER — OMEPRAZOLE 40 MG PO CPDR: DELAYED_RELEASE_CAPSULE | ORAL | Status: DC

## 2016-04-28 MED ORDER — PHENTERMINE HCL 30 MG PO CAPS: 30.0000 mg | ORAL_CAPSULE | ORAL | Status: DC

## 2016-04-28 NOTE — Assessment & Plan Note (Addendum)
Chronic mild off meds.  Recheck today.

## 2016-04-28 NOTE — Assessment & Plan Note (Signed)
Overdue for f/u with ophtho - advised schedule f/u

## 2016-04-28 NOTE — Patient Instructions (Addendum)
Pass by lab for stool kit.  Labs today. We will refer you to podiatrist.  Trial phentermine '30mg'$  1/2-1 tablet as needed once daily for appetite suppression. If any side effects like elevated blood pressure, trouble sleeping, chest pain, stop and let us know. return in 1 month for f/u phentermine and weight.   Health Maintenance, Female Adopting a healthy lifestyle and getting preventive care can go a long way to promote health and wellness. Talk with your health care provider about what schedule of regular examinations is right for you. This is a good chance for you to check in with your provider about disease prevention and staying healthy. In between checkups, there are plenty of things you can do on your own. Experts have done a lot of research about which lifestyle changes and preventive measures are most likely to keep you healthy. Ask your health care provider for more information. WEIGHT AND DIET  Eat a healthy diet  Be sure to include plenty of vegetables, fruits, low-fat dairy products, and lean protein.  Do not eat a lot of foods high in solid fats, added sugars, or salt.  Get regular exercise. This is one of the most important things you can do for your health.  Most adults should exercise for at least 150 minutes each week. The exercise should increase your heart rate and make you sweat (moderate-intensity exercise).  Most adults should also do strengthening exercises at least twice a week. This is in addition to the moderate-intensity exercise.  Maintain a healthy weight  Body mass index (BMI) is a measurement that can be used to identify possible weight problems. It estimates body fat based on height and weight. Your health care provider can help determine your BMI and help you achieve or maintain a healthy weight.  For females 3 years of age and older:   A BMI below 18.5 is considered underweight.  A BMI of 18.5 to 24.9 is normal.  A BMI of 25 to 29.9 is considered  overweight.  A BMI of 30 and above is considered obese.  Watch levels of cholesterol and blood lipids  You should start having your blood tested for lipids and cholesterol at 58 years of age, then have this test every 5 years.  You may need to have your cholesterol levels checked more often if:  Your lipid or cholesterol levels are high.  You are older than 58 years of age.  You are at high risk for heart disease.  CANCER SCREENING   Lung Cancer  Lung cancer screening is recommended for adults 52-22 years old who are at high risk for lung cancer because of a history of smoking.  A yearly low-dose CT scan of the lungs is recommended for people who:  Currently smoke.  Have quit within the past 15 years.  Have at least a 30-pack-year history of smoking. A pack year is smoking an average of one pack of cigarettes a day for 1 year.  Yearly screening should continue until it has been 15 years since you quit.  Yearly screening should stop if you develop a health problem that would prevent you from having lung cancer treatment.  Breast Cancer  Practice breast self-awareness. This means understanding how your breasts normally appear and feel.  It also means doing regular breast self-exams. Let your health care provider know about any changes, no matter how small.  If you are in your 20s or 30s, you should have a clinical breast exam (CBE) by a  health care provider every 1-3 years as part of a regular health exam.  If you are 1 or older, have a CBE every year. Also consider having a breast X-ray (mammogram) every year.  If you have a family history of breast cancer, talk to your health care provider about genetic screening.  If you are at high risk for breast cancer, talk to your health care provider about having an MRI and a mammogram every year.  Breast cancer gene (BRCA) assessment is recommended for women who have family members with BRCA-related cancers. BRCA-related  cancers include:  Breast.  Ovarian.  Tubal.  Peritoneal cancers.  Results of the assessment will determine the need for genetic counseling and BRCA1 and BRCA2 testing. Cervical Cancer Your health care provider may recommend that you be screened regularly for cancer of the pelvic organs (ovaries, uterus, and vagina). This screening involves a pelvic examination, including checking for microscopic changes to the surface of your cervix (Pap test). You may be encouraged to have this screening done every 3 years, beginning at age 25.  For women ages 17-65, health care providers may recommend pelvic exams and Pap testing every 3 years, or they may recommend the Pap and pelvic exam, combined with testing for human papilloma virus (HPV), every 5 years. Some types of HPV increase your risk of cervical cancer. Testing for HPV may also be done on women of any age with unclear Pap test results.  Other health care providers may not recommend any screening for nonpregnant women who are considered low risk for pelvic cancer and who do not have symptoms. Ask your health care provider if a screening pelvic exam is right for you.  If you have had past treatment for cervical cancer or a condition that could lead to cancer, you need Pap tests and screening for cancer for at least 20 years after your treatment. If Pap tests have been discontinued, your risk factors (such as having a new sexual partner) need to be reassessed to determine if screening should resume. Some women have medical problems that increase the chance of getting cervical cancer. In these cases, your health care provider may recommend more frequent screening and Pap tests. Colorectal Cancer  This type of cancer can be detected and often prevented.  Routine colorectal cancer screening usually begins at 58 years of age and continues through 58 years of age.  Your health care provider may recommend screening at an earlier age if you have risk  factors for colon cancer.  Your health care provider may also recommend using home test kits to check for hidden blood in the stool.  A small camera at the end of a tube can be used to examine your colon directly (sigmoidoscopy or colonoscopy). This is done to check for the earliest forms of colorectal cancer.  Routine screening usually begins at age 43.  Direct examination of the colon should be repeated every 5-10 years through 58 years of age. However, you may need to be screened more often if early forms of precancerous polyps or small growths are found. Skin Cancer  Check your skin from head to toe regularly.  Tell your health care provider about any new moles or changes in moles, especially if there is a change in a mole's shape or color.  Also tell your health care provider if you have a mole that is larger than the size of a pencil eraser.  Always use sunscreen. Apply sunscreen liberally and repeatedly throughout the day.  Protect yourself by wearing long sleeves, pants, a wide-brimmed hat, and sunglasses whenever you are outside. HEART DISEASE, DIABETES, AND HIGH BLOOD PRESSURE   High blood pressure causes heart disease and increases the risk of stroke. High blood pressure is more likely to develop in:  People who have blood pressure in the high end of the normal range (130-139/85-89 mm Hg).  People who are overweight or obese.  People who are African American.  If you are 29-4 years of age, have your blood pressure checked every 3-5 years. If you are 42 years of age or older, have your blood pressure checked every year. You should have your blood pressure measured twice--once when you are at a hospital or clinic, and once when you are not at a hospital or clinic. Record the average of the two measurements. To check your blood pressure when you are not at a hospital or clinic, you can use:  An automated blood pressure machine at a pharmacy.  A home blood pressure  monitor.  If you are between 27 years and 61 years old, ask your health care provider if you should take aspirin to prevent strokes.  Have regular diabetes screenings. This involves taking a blood sample to check your fasting blood sugar level.  If you are at a normal weight and have a low risk for diabetes, have this test once every three years after 58 years of age.  If you are overweight and have a high risk for diabetes, consider being tested at a younger age or more often. PREVENTING INFECTION  Hepatitis B  If you have a higher risk for hepatitis B, you should be screened for this virus. You are considered at high risk for hepatitis B if:  You were born in a country where hepatitis B is common. Ask your health care provider which countries are considered high risk.  Your parents were born in a high-risk country, and you have not been immunized against hepatitis B (hepatitis B vaccine).  You have HIV or AIDS.  You use needles to inject street drugs.  You live with someone who has hepatitis B.  You have had sex with someone who has hepatitis B.  You get hemodialysis treatment.  You take certain medicines for conditions, including cancer, organ transplantation, and autoimmune conditions. Hepatitis C  Blood testing is recommended for:  Everyone born from [birth year] through 1963.  Anyone with known risk factors for hepatitis C. Sexually transmitted infections (STIs)  You should be screened for sexually transmitted infections (STIs) including gonorrhea and chlamydia if:  You are sexually active and are younger than 58 years of age.  You are older than 58 years of age and your health care provider tells you that you are at risk for this type of infection.  Your sexual activity has changed since you were last screened and you are at an increased risk for chlamydia or gonorrhea. Ask your health care provider if you are at risk.  If you do not have HIV, but are at risk, it may be  recommended that you take a prescription medicine daily to prevent HIV infection. This is called pre-exposure prophylaxis (PrEP). You are considered at risk if:  You are sexually active and do not regularly use condoms or know the HIV status of your partner(s).  You take drugs by injection.  You are sexually active with a partner who has HIV. Talk with your health care provider about whether you are at high risk of being infected  with HIV. If you choose to begin PrEP, you should first be tested for HIV. You should then be tested every 3 months for as long as you are taking PrEP.  PREGNANCY   If you are premenopausal and you may become pregnant, ask your health care provider about preconception counseling.  If you may become pregnant, take 400 to 800 micrograms (mcg) of folic acid every day.  If you want to prevent pregnancy, talk to your health care provider about birth control (contraception). OSTEOPOROSIS AND MENOPAUSE   Osteoporosis is a disease in which the bones lose minerals and strength with aging. This can result in serious bone fractures. Your risk for osteoporosis can be identified using a bone density scan.  If you are 30 years of age or older, or if you are at risk for osteoporosis and fractures, ask your health care provider if you should be screened.  Ask your health care provider whether you should take a calcium or vitamin D supplement to lower your risk for osteoporosis.  Menopause may have certain physical symptoms and risks.  Hormone replacement therapy may reduce some of these symptoms and risks. Talk to your health care provider about whether hormone replacement therapy is right for you.  HOME CARE INSTRUCTIONS   Schedule regular health, dental, and eye exams.  Stay current with your immunizations.   Do not use any tobacco products including cigarettes, chewing tobacco, or electronic cigarettes.  If you are pregnant, do not drink alcohol.  If you are  breastfeeding, limit how much and how often you drink alcohol.  Limit alcohol intake to no more than 1 drink per day for nonpregnant women. One drink equals 12 ounces of beer, 5 ounces of wine, or 1 ounces of hard liquor.  Do not use street drugs.  Do not share needles.  Ask your health care provider for help if you need support or information about quitting drugs.  Tell your health care provider if you often feel depressed.  Tell your health care provider if you have ever been abused or do not feel safe at home.   This information is not intended to replace advice given to you by your health care provider. Make sure you discuss any questions you have with your health care provider.   Document Released: 05/15/2011 Document Revised: 11/20/2014 Document Reviewed: 10/01/2013 Elsevier Interactive Patient Education [employer].

## 2016-04-28 NOTE — Assessment & Plan Note (Addendum)
Ongoing pain >2 yrs, no reproducible pain today. Failed conservative plantar fasciitis treatment. ?heel spurs vs other. Will refer to podiatry.

## 2016-04-28 NOTE — Assessment & Plan Note (Signed)
Followed by headache clinic, on topamax

## 2016-04-28 NOTE — Assessment & Plan Note (Signed)
Discussed healthy diet and lifestyle changes to affect sustainable weight loss. Pt interested in pharmacotherapy - discussed options namely phentermine vs belviq. Pt desires trial phentermine. Discussed risks/benefits of medication and mechanism of action. Start 30mg  phentermine for appetite suppression. Update with effect. RTC 1 mo f/u visit.

## 2016-04-28 NOTE — Progress Notes (Signed)
Pre visit review using our clinic review tool, if applicable. No additional management support is needed unless otherwise documented below in the visit note. 

## 2016-04-28 NOTE — Assessment & Plan Note (Signed)
Preventative protocols reviewed and updated unless pt declined. Discussed healthy diet and lifestyle.  
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
## 2016-05-01 ENCOUNTER — Encounter: Payer: Self-pay | Admitting: *Deleted

## 2016-05-09 ENCOUNTER — Other Ambulatory Visit: Payer: 59

## 2016-05-10 ENCOUNTER — Other Ambulatory Visit (INDEPENDENT_AMBULATORY_CARE_PROVIDER_SITE_OTHER): Payer: 59

## 2016-05-10 ENCOUNTER — Other Ambulatory Visit: Payer: Self-pay | Admitting: Family Medicine

## 2016-05-10 DIAGNOSIS — Z1211 Encounter for screening for malignant neoplasm of colon: Secondary | ICD-10-CM | POA: Diagnosis not present

## 2016-05-10 LAB — FECAL OCCULT BLOOD, IMMUNOCHEMICAL: Fecal Occult Bld: NEGATIVE

## 2016-05-25 ENCOUNTER — Ambulatory Visit (INDEPENDENT_AMBULATORY_CARE_PROVIDER_SITE_OTHER): Payer: 59 | Admitting: Family Medicine

## 2016-05-25 ENCOUNTER — Encounter: Payer: Self-pay | Admitting: Family Medicine

## 2016-05-25 VITALS — BP 106/74 | HR 86 | Temp 97.5°F | Wt 184.2 lb

## 2016-05-25 DIAGNOSIS — M79671 Pain in right foot: Secondary | ICD-10-CM

## 2016-05-25 DIAGNOSIS — E669 Obesity, unspecified: Secondary | ICD-10-CM

## 2016-05-25 DIAGNOSIS — M79672 Pain in left foot: Secondary | ICD-10-CM | POA: Diagnosis not present

## 2016-05-25 MED ORDER — PHENTERMINE HCL 37.5 MG PO CAPS: 37.5000 mg | ORAL_CAPSULE | ORAL | Status: DC

## 2016-05-25 NOTE — Patient Instructions (Signed)
Higher phentermine 37.5mg  daily over next few months. Let me know if new symptoms of headache or chest pain develop.  Return in 2 months for follow up visit.

## 2016-05-25 NOTE — Progress Notes (Signed)
BP 106/74 mmHg  Pulse 86  Temp(Src) 97.5 F (36.4 C) (Oral)  Wt 184 lb 4 oz (83.575 kg)  SpO2 96%   CC: f/u weight  Subjective:    Patient ID: Kelly Francis, female    DOB: Jul 08, 1956, 60 y.o.   MRN: [id number]  HPI: Kelly Francis is a 60 y.o. female presenting on 05/25/2016 for Follow-up   See prior note for details. Briefly, seen here last month for CPE, started on phentermine 30mg  daily for appetite suppression. 9 lb weight loss noted, pt states 10-11 lbs. Takes at [facility]. Denies chest pain, headache, insomnia.   Notes dry mouth side effect.   Not exercising as much as able due to foot pain. Has appt with podiatrist at end of month.   S/p cardiac evaluation 2015 - normal treadmill stress test. Thought chest pain was GI in origin.   Relevant past medical, surgical, family and social history reviewed and updated as indicated. Interim medical history since our last visit reviewed. Allergies and medications reviewed and updated. Current Outpatient Prescriptions on File Prior to Visit  Medication Sig  . cetirizine (ZYRTEC) 10 MG tablet Take 10 mg by mouth daily as needed for allergies.  . cloNIDine (CATAPRES) 0.1 MG tablet Take 0.1 mg by mouth daily. For hot flashes   . CVS VITAMIN B-12 500 MCG tablet TAKE 1 TABLET BY MOUTH EVERY DAY  . EPINEPHrine (EPIPEN) 0.3 mg/0.3 mL IJ SOAJ injection Inject 0.3 mLs (0.3 mg total) into the muscle once.  Marland Kitchen estradiol (ESTRACE) 1 MG tablet Take 1 mg by mouth daily.    . nitroGLYCERIN (NITROSTAT) 0.4 MG SL tablet Place 1 tablet (0.4 mg total) under the tongue every 5 (five) minutes as needed for chest pain.  Marland Kitchen omeprazole (PRILOSEC) 40 MG capsule TAKE 1 CAPSULE (40 MG TOTAL) BY MOUTH DAILY. [place].  Marland Kitchen topiramate (TOPAMAX) 25 MG tablet Take 25 mg by mouth at bedtime.     No current facility-administered medications on file prior to visit.    Review of Systems Per HPI unless specifically indicated in ROS section     Objective:    BP 106/74 mmHg  Pulse 86  Temp(Src) 97.5 F (36.4 C) (Oral)  Wt 184 lb 4 oz (83.575 kg)  SpO2 96%  Wt Readings from Last 3 Encounters:  05/25/16 184 lb 4 oz (83.575 kg)  04/28/16 193 lb (87.544 kg)  04/22/15 186 lb 12 oz (84.709 kg)   Body mass index is 33.14 kg/(m^2).  Physical Exam  Constitutional: She appears well-developed and well-nourished. No distress.  HENT:  Mouth/Throat: Oropharynx is clear and moist. No oropharyngeal exudate.  Cardiovascular: Normal rate, regular rhythm, normal heart sounds and intact distal pulses.   No murmur heard. Pulmonary/Chest: Effort normal and breath sounds normal. No respiratory distress. She has no wheezes. She has no rales.  Musculoskeletal: She exhibits no edema.  Skin: Skin is warm and dry. No rash noted.  Psychiatric: She has a normal mood and affect.  Nursing note and vitals reviewed.     Assessment & Plan:   Problem List Items Addressed This Visit    Pain of both heels    Still awaiting podiatry evaluation.       Obesity, Class I, BMI 30-34.9 - Primary    Phentermine successful up to now.  Pt motivated to continue weight loss. Will increase phentermine to 37.5mg  daily.  #30 RF#3 provided today rec return in 2 mo f/u visit.  Relevant Medications   phentermine 37.5 MG capsule       Follow up plan: Return in about 2 months (around 07/26/2016) for follow up visit.  [name], MD

## 2016-05-25 NOTE — Assessment & Plan Note (Signed)
Phentermine successful up to now.  Pt motivated to continue weight loss. Will increase phentermine to 37.5mg  daily.  #30 RF#3 provided today rec return in 2 mo f/u visit.

## 2016-05-25 NOTE — Progress Notes (Signed)
Pre visit review using our clinic review tool, if applicable. No additional management support is needed unless otherwise documented below in the visit note. 

## 2016-05-25 NOTE — Assessment & Plan Note (Signed)
Still awaiting podiatry evaluation.

## 2016-05-30 ENCOUNTER — Telehealth: Payer: Self-pay | Admitting: Family Medicine

## 2016-05-30 ENCOUNTER — Ambulatory Visit (INDEPENDENT_AMBULATORY_CARE_PROVIDER_SITE_OTHER): Payer: 59 | Admitting: Internal Medicine

## 2016-05-30 ENCOUNTER — Encounter: Payer: Self-pay | Admitting: Internal Medicine

## 2016-05-30 VITALS — BP 120/84 | HR 80 | Temp 98.5°F | Wt 180.8 lb

## 2016-05-30 DIAGNOSIS — T63461A Toxic effect of venom of wasps, accidental (unintentional), initial encounter: Secondary | ICD-10-CM

## 2016-05-30 NOTE — Progress Notes (Signed)
Pre visit review using our clinic review tool, if applicable. No additional management support is needed unless otherwise documented below in the visit note. 

## 2016-05-30 NOTE — Telephone Encounter (Signed)
Pt has appt with R Baity NP 05/30/16 at 10:30.

## 2016-05-30 NOTE — Telephone Encounter (Signed)
[place] Patient Name: Kelly Francis DOB: [birth year] Initial Comment Caller states she was stung by a wasp Sunday evening several times on the corner of her eyelid and multiple areas on her body. She is very swollen. She has been taking Benadryl, IBP and using cold packs. Nurse Assessment Nurse: Robby Sermon, RN, April Date/Time Eilene Ghazi Time): 05/30/2016 8:26:03 AM Confirm and document reason for call. If symptomatic, describe symptoms. You must click the next button to save text entered. ---Caller states she moved a pillow and disturbed a wasp's nest. She was stung on both feet, and once on the corner of her eye lid toward outside (not nose). This happened Sunday night. She was also stung on her thigh and once on her knee. She is most concerned about her eye. She said she has an epi pen but has not used it. Her eye is red from her eye to her cheek bone like a triangle. The swelling is under her eye and on her eyelid. It has gone down some after using ice then cool compresses yesterday along with Benadryl and ibuprofen. She states the swelling has increased. Has the patient traveled out of the country within the last 30 days? ---No Does the patient have any new or worsening symptoms? ---Yes Will a triage be completed? ---Yes Related visit to physician within the last 2 weeks? ---Yes Does the PT have any chronic conditions? (i.e. diabetes, asthma, etc.) ---Yes List chronic conditions. 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 ---No Guidelines Guideline Title Affirmed Question Affirmed Notes Bee or Yellow Jacket Sting Swelling is huge (e.g., > 4 inches or 10 cm, spreads beyond wrist or ankle) Final Disposition User See Physician within [place], RN, April Referrals REFERRED TO PCP OFFICE Disagree/Comply: Comply
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
## 2016-05-30 NOTE — Patient Instructions (Signed)
Bee, Wasp, or Hornet Sting °Bees, wasps, and hornets are part of a family of insects that can sting people. These stings can cause pain and inflammation, but they are usually not serious. However, some people may have an allergic reaction to a sting. This can cause the symptoms to be more severe.  °SYMPTOMS  °Common symptoms of this condition include:  °· A red lump in the skin that sometimes has a tiny hole in the center. In some cases, a stinger may be in the center of the wound. °· Pain and itching at the sting site. °· Redness and swelling around the sting site. If you have an allergic reaction (localized allergic reaction), the swelling and redness may spread out from the sting site. In some cases, this reaction can continue to develop over the next 12-36 hours. °In rare cases, a person may have a severe allergic reaction (anaphylactic reaction) to a sting. Symptoms of an anaphylactic reaction may include:  °· Wheezing or difficulty breathing. °· Raised, itchy, red patches on the skin. °· Nausea or vomiting. °· Abdominal cramping. °· Diarrhea. °· Chest pain. °· Fainting. °· Redness of the face (flushing). °DIAGNOSIS  °This condition is usually diagnosed based on symptoms, medical history, and a physical exam. °TREATMENT  °Most stings can be treated with:  °· Icing to reduce swelling. °· Medicines (antihistamines) to treat itching or an allergic reaction. °· Medicines to help reduce pain. These may be medicines that you take by mouth, or medicated creams or lotions that you apply to your skin. °If you were stung by a bee, the stinger and a small sac of poison may be in the wound. This may be removed by brushing across it with a flat card, such as a credit card. Another method is to pinch the area and pull it out. These methods can help reduce the severity of the body's reaction to the sting.  °HOME CARE INSTRUCTIONS  °· Wash the sting site daily with soap and water as told by your health care provider. °· Apply  or take over-the-counter and prescription medicines only as told by your health care provider. °· If directed, apply ice to the sting area. °¨  Put ice in a plastic bag. °¨  Place a towel between your skin and the bag. °¨  Leave the ice on for 20 minutes, 2-3 times per day. °· Do not scratch the sting area. °· To lessen pain, try using a paste that is made of water and baking soda. Rub the paste on the sting area and leave it on for 5 minutes. °· If you had a severe allergic reaction to a sting, you may need: °¨  To wear a medical bracelet or necklace that lists the allergy. °¨  To learn when and how to use an anaphylaxis kit or epinephrine injection. Your family members may also need to learn this. °¨  To carry an anaphylaxis kit with you at all times. °SEEK MEDICAL CARE IF:  °· Your symptoms do not get better in 2-3 days. °· You have redness, swelling, or pain that spreads beyond the area of the sting. °· You have a fever. °SEEK IMMEDIATE MEDICAL CARE IF:  °You have symptoms of a severe allergic reaction. These include:  °· Wheezing or difficulty breathing. °· Chest pain. °· Light-headedness or fainting. °· Itchy, raised, red patches on the skin. °· Nausea or vomiting. °· Abdominal cramping. °· Diarrhea. °  °This information is not intended to replace advice given to you by your health care provider.   Make sure you discuss any questions you have with your health care provider. °  °Document Released: 10/30/2005 Document Revised: 07/21/2015 Document Reviewed: 03/17/2015 °Elsevier Interactive Patient Education ©2016 [employer] ° °

## 2016-06-12 ENCOUNTER — Ambulatory Visit: Payer: Self-pay | Admitting: Podiatry

## 2016-09-22 ENCOUNTER — Other Ambulatory Visit: Payer: Self-pay | Admitting: *Deleted

## 2016-09-22 NOTE — Telephone Encounter (Signed)
Patient wants to know if she needs to keep the 09/29/16 appointment if the prescription is filled.

## 2016-09-22 NOTE — Telephone Encounter (Signed)
Plan was 2 mo f/u after 05/2016 appt. No refills until seen in office.

## 2016-09-22 NOTE — Telephone Encounter (Signed)
Ok to refill? Last filled 05/25/16 #30 3 RF. Appt next Friday. Needs RF prior to appt.

## 2016-09-25 NOTE — Telephone Encounter (Signed)
Pt has an appt on 11/17

## 2016-09-29 ENCOUNTER — Ambulatory Visit (INDEPENDENT_AMBULATORY_CARE_PROVIDER_SITE_OTHER): Payer: 59 | Admitting: Family Medicine

## 2016-09-29 ENCOUNTER — Encounter: Payer: Self-pay | Admitting: Family Medicine

## 2016-09-29 VITALS — BP 122/82 | HR 90 | Temp 98.0°F | Wt 158.2 lb

## 2016-09-29 DIAGNOSIS — M79671 Pain in right foot: Secondary | ICD-10-CM

## 2016-09-29 DIAGNOSIS — M79672 Pain in left foot: Secondary | ICD-10-CM | POA: Diagnosis not present

## 2016-09-29 DIAGNOSIS — E663 Overweight: Secondary | ICD-10-CM | POA: Diagnosis not present

## 2016-09-29 MED ORDER — PHENTERMINE HCL 37.5 MG PO CAPS: 37.5000 mg | ORAL_CAPSULE | ORAL | 1 refills | Status: DC

## 2016-09-29 NOTE — Progress Notes (Signed)
Pre visit review using our clinic review tool, if applicable. No additional management support is needed unless otherwise documented below in the visit note. 

## 2016-09-29 NOTE — Assessment & Plan Note (Addendum)
Phentermine successful for weight loss, total 35 lbs to date (over 5 months). Will continue 37.5mg  as pt tolerating well for next 2 months. Discussed will likely discontinue at future visit.  For elevated heart rate noted today - I reviewed how to check pulse at home and asked patient to monitor at home and notify me if persistently >90 to decrease phentermine dose. Pt agrees with plan. RTC 2 mo f/u visit

## 2016-09-29 NOTE — Progress Notes (Addendum)
BP 122/82   Pulse 90   Temp 98 F (36.7 C) (Oral)   Wt 158 lb 4 oz (71.8 kg)   SpO2 97%   BMI 28.48 kg/m    CC: 1mo f/u visit Subjective:    Patient ID: Kelly Francis, female    DOB: 04/14/1956, 60 y.o.   MRN: [id number] This this  HPI: Kelly Francis is a 60 y.o. female presenting on 09/29/2016 for Follow-up   See prior note for details. Phentermine started 04/2016. 9 lb weight loss noted last visit. Today we have seen another 22lbs, total 35lbs down. This was increased to 37.5mg  daily last visit (05/2016). Did not return for 31mo f/u.   Denies chest pain, headache, insomnia. Notes dry mouth side effect.   Has changed diet - low carb. Boiled eggs, [place], chicken for protein. Fruits/vegetables regularly.   Foot pain has improved. Not exercising much. She does have treadmill at home.   Relevant past medical, surgical, family and social history reviewed and updated as indicated. Interim medical history since our last visit reviewed. Allergies and medications reviewed and updated. Current Outpatient Prescriptions on File Prior to Visit  Medication Sig  . cetirizine (ZYRTEC) 10 MG tablet Take 10 mg by mouth daily as needed for allergies.  . cloNIDine (CATAPRES) 0.1 MG tablet Take 0.1 mg by mouth daily. For hot flashes   . CVS VITAMIN B-12 500 MCG tablet TAKE 1 TABLET BY MOUTH EVERY DAY  . EPINEPHrine (EPIPEN) 0.3 mg/0.3 mL IJ SOAJ injection Inject 0.3 mLs (0.3 mg total) into the muscle once.  Marland Kitchen estradiol (ESTRACE) 1 MG tablet Take 1 mg by mouth daily.    . nitroGLYCERIN (NITROSTAT) 0.4 MG SL tablet Place 1 tablet (0.4 mg total) under the tongue every 5 (five) minutes as needed for chest pain.  Marland Kitchen omeprazole (PRILOSEC) 40 MG capsule TAKE 1 CAPSULE (40 MG TOTAL) BY MOUTH DAILY. [place].  Marland Kitchen topiramate (TOPAMAX) 25 MG tablet Take 25 mg by mouth at bedtime.     No current facility-administered medications on file prior to visit.     Review of Systems Per HPI unless  specifically indicated in ROS section     Objective:    BP 122/82   Pulse 90   Temp 98 F (36.7 C) (Oral)   Wt 158 lb 4 oz (71.8 kg)   SpO2 97%   BMI 28.48 kg/m   Wt Readings from Last 3 Encounters:  09/29/16 158 lb 4 oz (71.8 kg)  05/30/16 180 lb 12 oz (82 kg)  05/25/16 184 lb 4 oz (83.6 kg)    Physical Exam  Constitutional: She appears well-developed and well-nourished. No distress.  HENT:  Mouth/Throat: Oropharynx is clear and moist. No oropharyngeal exudate.  Cardiovascular: Normal rate, regular rhythm, normal heart sounds and intact distal pulses.   No murmur heard. Pulmonary/Chest: Effort normal and breath sounds normal. No respiratory distress. She has no wheezes. She has no rales.  Musculoskeletal: She exhibits no edema.  Skin: Skin is warm and dry. No rash noted.  Psychiatric: She has a normal mood and affect.  Nursing note and vitals reviewed.      Assessment & Plan:   Problem List Items Addressed This Visit    Overweight (BMI 25.0-29.9)    Phentermine successful for weight loss, total 35 lbs to date (over 5 months). Will continue 37.5mg  as pt tolerating well for next 2 months. Discussed will likely discontinue at future visit.  For elevated heart rate noted  today - I reviewed how to check pulse at home and asked patient to monitor at home and notify me if persistently >90 to decrease phentermine dose. Pt agrees with plan. RTC 2 mo f/u visit      Pain of both heels - Primary    This has improved - encouraged regular walking routine on her treadmill          Follow up plan: Return in about 2 months (around 11/29/2016) for follow up visit.  [name], MD

## 2016-09-29 NOTE — Patient Instructions (Addendum)
Congratulations on weight loss! Continue phentermine daily - refilled today. Return in 2 months for follow up visit.  Keep an eye on pulse - count your heart rate on your wrist over 30 seconds and multiply by 2 - this is your pulse. Goal <90. If consistently >90 or >100, let us know.

## 2016-09-29 NOTE — Assessment & Plan Note (Signed)
This has improved - encouraged regular walking routine on her treadmill

## 2016-10-11 ENCOUNTER — Other Ambulatory Visit: Payer: Self-pay | Admitting: Family Medicine

## 2016-11-07 ENCOUNTER — Ambulatory Visit (INDEPENDENT_AMBULATORY_CARE_PROVIDER_SITE_OTHER): Payer: 59 | Admitting: Family Medicine

## 2016-11-07 ENCOUNTER — Ambulatory Visit: Payer: 59 | Admitting: Family

## 2016-11-07 ENCOUNTER — Encounter: Payer: Self-pay | Admitting: Family Medicine

## 2016-11-07 VITALS — BP 126/78 | HR 88 | Temp 98.2°F | Wt 162.0 lb

## 2016-11-07 DIAGNOSIS — J019 Acute sinusitis, unspecified: Secondary | ICD-10-CM | POA: Insufficient documentation

## 2016-11-07 MED ORDER — AMOXICILLIN-POT CLAVULANATE 875-125 MG PO TABS: 1.0000 | ORAL_TABLET | Freq: Two times a day (BID) | ORAL | 0 refills | Status: AC

## 2016-11-07 NOTE — Progress Notes (Signed)
BP 126/78   Pulse 88   Temp 98.2 F (36.8 C) (Oral)   Wt 162 lb (73.5 kg)   SpO2 98%   BMI 29.16 kg/m    CC: head congestion x 10 days Subjective:    Patient ID: Vara Osterkamp, female    DOB: January 01, 1956, 60 y.o.   MRN: [id number]  HPI: Ayane Mousseau is a 60 y.o. female presenting on 11/07/2016 for Sinusitis (x 1.5 weeks; head congestion, ears muffled; OTC meds no help) and Bronchitis (cough)   1.5 wk h/o ST, head congestion. "This is worse cold I've ever had". Muffled hearing. Last week noted some blood in sputum. This morning brown sputum. Hoarse harsh cough present. Chest discomfort with cough present. Headaches treated with excedrin.   No fevers/chills, chest congestion.   Treating with OTC remedies including mucinex.  Codeine med causes migraines.  Sick contacts at work.  Not around smokers.  No h/o asthma.   Relevant past medical, surgical, family and social history reviewed and updated as indicated. Interim medical history since our last visit reviewed. Allergies and medications reviewed and updated. Current Outpatient Prescriptions on File Prior to Visit  Medication Sig  . cetirizine (ZYRTEC) 10 MG tablet Take 10 mg by mouth daily as needed for allergies.  . cloNIDine (CATAPRES) 0.1 MG tablet Take 0.1 mg by mouth daily. For hot flashes   . CVS VITAMIN B-12 500 MCG tablet TAKE 1 TABLET BY MOUTH EVERY DAY  . estradiol (ESTRACE) 1 MG tablet Take 1 mg by mouth daily.    Marland Kitchen omeprazole (PRILOSEC) 40 MG capsule TAKE 1 CAPSULE (40 MG TOTAL) BY MOUTH DAILY. [place].  Marland Kitchen topiramate (TOPAMAX) 25 MG tablet Take 25 mg by mouth at bedtime.    Marland Kitchen EPINEPHrine (EPIPEN) 0.3 mg/0.3 mL IJ SOAJ injection Inject 0.3 mLs (0.3 mg total) into the muscle once. (Patient not taking: Reported on 11/07/2016)  . nitroGLYCERIN (NITROSTAT) 0.4 MG SL tablet Place 1 tablet (0.4 mg total) under the tongue every 5 (five) minutes as needed for chest pain. (Patient not taking: Reported on  11/07/2016)  . phentermine 37.5 MG capsule Take 1 capsule (37.5 mg total) by mouth every morning. (Patient not taking: Reported on 11/07/2016)   No current facility-administered medications on file prior to visit.     Review of Systems Per HPI unless specifically indicated in ROS section     Objective:    BP 126/78   Pulse 88   Temp 98.2 F (36.8 C) (Oral)   Wt 162 lb (73.5 kg)   SpO2 98%   BMI 29.16 kg/m   Wt Readings from Last 3 Encounters:  11/07/16 162 lb (73.5 kg)  09/29/16 158 lb 4 oz (71.8 kg)  05/30/16 180 lb 12 oz (82 kg)    Physical Exam  Constitutional: She appears well-developed and well-nourished. No distress.  HENT:  Head: Normocephalic and atraumatic.  Right Ear: Hearing, tympanic membrane, external ear and ear canal normal.  Left Ear: Hearing, tympanic membrane, external ear and ear canal normal.  Nose: Mucosal edema (L>R with nasal erythema) present. No rhinorrhea. Right sinus exhibits no maxillary sinus tenderness and no frontal sinus tenderness. Left sinus exhibits no maxillary sinus tenderness and no frontal sinus tenderness.  Mouth/Throat: Uvula is midline, oropharynx is clear and moist and mucous membranes are normal. No oropharyngeal exudate, posterior oropharyngeal edema, posterior oropharyngeal erythema or tonsillar abscesses.  Eyes: Conjunctivae and EOM are normal. Pupils are equal, round, and reactive to light. No scleral  icterus.  Neck: Normal range of motion. Neck supple.  Cardiovascular: Normal rate, regular rhythm, normal heart sounds and intact distal pulses.   No murmur heard. Pulmonary/Chest: Effort normal and breath sounds normal. No respiratory distress. She has no wheezes. She has no rales.  Lungs clear  Lymphadenopathy:    She has no cervical adenopathy.  Skin: Skin is warm and dry. No rash noted.  Nursing note and vitals reviewed.     Assessment & Plan:   Problem List Items Addressed This Visit    Acute sinusitis - Primary     Given duration and progression of sxs, cover for bacterial cause with augmentin 10d course. rec fluids, rest, mucinex, update if not improved with treatment. Pt agrees with plan.      Relevant Medications   amoxicillin-clavulanate (AUGMENTIN) 875-125 MG tablet       Follow up plan: Return if symptoms worsen or fail to improve.  [name], MD

## 2016-11-07 NOTE — Progress Notes (Signed)
Pre visit review using our clinic review tool, if applicable. No additional management support is needed unless otherwise documented below in the visit note. 

## 2016-11-07 NOTE — Patient Instructions (Addendum)
You have a sinus infection.  Take medicine as prescribed: augmentin 10 day course - take with food.  Push fluids and plenty of rest. Nasal saline irrigation or neti pot to help drain sinuses.  May use plain mucinex with plenty of fluid to help mobilize mucous.  Please let us know if fever >101.5, trouble opening/closing mouth, difficulty swallowing, or worsening instead of improving as expected.

## 2016-11-07 NOTE — Assessment & Plan Note (Signed)
Given duration and progression of sxs, cover for bacterial cause with augmentin 10d course. rec fluids, rest, mucinex, update if not improved with treatment. Pt agrees with plan.

## 2016-12-01 ENCOUNTER — Ambulatory Visit (INDEPENDENT_AMBULATORY_CARE_PROVIDER_SITE_OTHER): Payer: 59 | Admitting: Family Medicine

## 2016-12-01 ENCOUNTER — Encounter: Payer: Self-pay | Admitting: Family Medicine

## 2016-12-01 VITALS — BP 110/80 | HR 102 | Temp 98.4°F | Wt 161.0 lb

## 2016-12-01 DIAGNOSIS — N951 Menopausal and female climacteric states: Secondary | ICD-10-CM | POA: Diagnosis not present

## 2016-12-01 DIAGNOSIS — Z79890 Hormone replacement therapy (postmenopausal): Secondary | ICD-10-CM | POA: Diagnosis not present

## 2016-12-01 DIAGNOSIS — G43009 Migraine without aura, not intractable, without status migrainosus: Secondary | ICD-10-CM | POA: Diagnosis not present

## 2016-12-01 DIAGNOSIS — E663 Overweight: Secondary | ICD-10-CM

## 2016-12-01 MED ORDER — LORCASERIN HCL 10 MG PO TABS: 10.0000 mg | ORAL_TABLET | Freq: Two times a day (BID) | ORAL | 1 refills | Status: DC

## 2016-12-01 NOTE — Progress Notes (Signed)
BP 110/80 (BP Location: Left Arm, Patient Position: Sitting, Cuff Size: Normal)   Pulse (!) 102   Temp 98.4 F (36.9 C) (Oral)   Wt 161 lb (73 kg)   SpO2 98%   BMI 28.98 kg/m    CC: weight loss, f/u recent sinus infection Subjective:    Patient ID: Kelly Francis, female    DOB: 06-17-1956, 61 y.o.   MRN: [id number]  HPI: Kelly Francis is a 61 y.o. female presenting on 12/01/2016 for Follow-up (Recent Sinus Infection) and Weight Loss   See prior note for details. Seen here 12/26 with acute sinusitis, treated with augmentin 10d course.   Phentermine started 04/2016, 37.5mg  daily. Total 35 lbs weight loss, then 3 lb weight gain over last 2 months. Healthy diet changes - low carb. She has tolerated this well, but notes weight has plateaued. Interested in trial of different medication for this.   Brings log of pulse - 70-80s overall.  Takes clonidine 0.1mg  daily for hot flashes. Takes topamax 25mg  nightly for headache prevention.   Relevant past medical, surgical, family and social history reviewed and updated as indicated. Interim medical history since our last visit reviewed. Allergies and medications reviewed and updated. Current Outpatient Prescriptions on File Prior to Visit  Medication Sig  . cetirizine (ZYRTEC) 10 MG tablet Take 10 mg by mouth daily as needed for allergies.  . cloNIDine (CATAPRES) 0.1 MG tablet Take 0.1 mg by mouth daily. For hot flashes   . CVS VITAMIN B-12 500 MCG tablet TAKE 1 TABLET BY MOUTH EVERY DAY  . EPINEPHrine (EPIPEN) 0.3 mg/0.3 mL IJ SOAJ injection Inject 0.3 mLs (0.3 mg total) into the muscle once.  Marland Kitchen estradiol (ESTRACE) 1 MG tablet Take 1 mg by mouth daily.    . nitroGLYCERIN (NITROSTAT) 0.4 MG SL tablet Place 1 tablet (0.4 mg total) under the tongue every 5 (five) minutes as needed for chest pain.  Marland Kitchen omeprazole (PRILOSEC) 40 MG capsule TAKE 1 CAPSULE (40 MG TOTAL) BY MOUTH DAILY. [place].  Marland Kitchen topiramate (TOPAMAX) 25 MG tablet Take  25 mg by mouth at bedtime.     No current facility-administered medications on file prior to visit.     Review of Systems Per HPI unless specifically indicated in ROS section     Objective:    BP 110/80 (BP Location: Left Arm, Patient Position: Sitting, Cuff Size: Normal)   Pulse (!) 102   Temp 98.4 F (36.9 C) (Oral)   Wt 161 lb (73 kg)   SpO2 98%   BMI 28.98 kg/m   Wt Readings from Last 3 Encounters:  12/01/16 161 lb (73 kg)  11/07/16 162 lb (73.5 kg)  09/29/16 158 lb 4 oz (71.8 kg)    Physical Exam  Constitutional: She appears well-developed and well-nourished. No distress.  HENT:  Mouth/Throat: Oropharynx is clear and moist. No oropharyngeal exudate.  Cardiovascular: Normal rate, regular rhythm, normal heart sounds and intact distal pulses.   No murmur heard. Pulmonary/Chest: Effort normal and breath sounds normal. No respiratory distress. She has no wheezes. She has no rales.  Skin: Skin is warm and dry. No rash noted.  Psychiatric: She has a normal mood and affect.  Nursing note and vitals reviewed.     Assessment & Plan:   Problem List Items Addressed This Visit    Common migraine    Followed by headache clinic, appreciate their care. Continue topamax.      Hot flash, menopausal   Overweight (BMI  25.0-29.9) - Primary    BMI >27, with comorbidity (HLD).  Pt feels phentermine has lost effect - discussed other options of contrave vs belviq as well as their mechanisms of action (already on topamax for migraine prevention). Pt desires to avoid antidepressant so will trial belviq.  Discussed temporary 2 mo trial of bariatric medication until spring when she will be able to be more active outdoors.  RTC 1 mo f/u visit.       Postmenopausal hormone replacement therapy    Reviewed cardiovascular risks of HRT, suggested she discuss with OBGYN.           Follow up plan: Return in about 4 weeks (around 12/29/2016) for follow up visit.  [name], MD

## 2016-12-01 NOTE — Progress Notes (Signed)
Pre visit review using our clinic review tool, if applicable. No additional management support is needed unless otherwise documented below in the visit note. 

## 2016-12-01 NOTE — Assessment & Plan Note (Signed)
BMI >27, with comorbidity (HLD).  Pt feels phentermine has lost effect - discussed other options of contrave vs belviq as well as their mechanisms of action (already on topamax for migraine prevention). Pt desires to avoid antidepressant so will trial belviq.  Discussed temporary 2 mo trial of bariatric medication until spring when she will be able to be more active outdoors.  RTC 1 mo f/u visit.

## 2016-12-01 NOTE — Assessment & Plan Note (Addendum)
Reviewed cardiovascular risks of HRT, suggested she discuss with OBGYN.

## 2016-12-01 NOTE — Assessment & Plan Note (Signed)
Followed by headache clinic, appreciate their care. Continue topamax.

## 2016-12-01 NOTE — Patient Instructions (Addendum)
Stop phentermine. Trial belviq - 10mg  daily for a few days then increase to 10mg  twice daily.  Other option is contrave.  Return in 1 month for follow up.

## 2016-12-04 ENCOUNTER — Telehealth: Payer: Self-pay

## 2016-12-04 ENCOUNTER — Telehealth: Payer: Self-pay | Admitting: Family Medicine

## 2016-12-04 DIAGNOSIS — E663 Overweight: Secondary | ICD-10-CM

## 2016-12-04 MED ORDER — PHENTERMINE HCL 37.5 MG PO TABS: 37.5000 mg | ORAL_TABLET | Freq: Every day | ORAL | 0 refills | Status: DC

## 2016-12-04 NOTE — Telephone Encounter (Signed)
Rx called in as directed.   

## 2016-12-04 NOTE — Telephone Encounter (Signed)
We can do this but as discussed at OV recommend temporary phentermine for 2 more months until spring weather more conducive to exercise. plz phone in.

## 2016-12-04 NOTE — Telephone Encounter (Signed)
Pot would like return phone call- she would like to take a different dose of phentermine and take it twice daily Thanks

## 2016-12-04 NOTE — Telephone Encounter (Addendum)
I suggest she cut phentermine tablets I've sent in 37.5mg  in half and take twice daily as needed. What is she requesting?

## 2016-12-04 NOTE — Telephone Encounter (Signed)
Pt left v/m; pt seen 12/01/16 and given Belviq; Belviq cost to pt is $290.00 and too expensive. Pt wondered about going back on phentermine in tablet form rather than capsule. [facility]. Pt request cb.

## 2016-12-05 NOTE — Telephone Encounter (Signed)
Patient notified

## 2016-12-20 NOTE — Telephone Encounter (Signed)
error 

## 2016-12-28 ENCOUNTER — Other Ambulatory Visit: Payer: Self-pay | Admitting: Family Medicine

## 2016-12-29 ENCOUNTER — Telehealth: Payer: Self-pay

## 2016-12-29 NOTE — Telephone Encounter (Signed)
Pt wants to know if will get another rx of phentermine; pt was seen 11/21/16; pt thought she would not have to be seen for 2 months; but the office note has return in one month. Pt wants to know if she has to come back in one month because at the Jan visit she understood she would get 2 months prescriptions and be seen in 2 months. Pt request cb.

## 2016-12-29 NOTE — Telephone Encounter (Signed)
We've phoned in new Rx with f/u next month. See Rx request from yesterday.

## 2016-12-29 NOTE — Telephone Encounter (Signed)
Rx called in as directed.   

## 2016-12-29 NOTE — Telephone Encounter (Signed)
Left message on VM that the rx had been sent in and that an OV next month is 2 months from previous visit.

## 2016-12-29 NOTE — Telephone Encounter (Signed)
Ok to refill? Last filled 12/04/16 #30 0RF 

## 2016-12-29 NOTE — Telephone Encounter (Signed)
plz phone in. 

## 2017-02-28 DIAGNOSIS — G43719 Chronic migraine without aura, intractable, without status migrainosus: Secondary | ICD-10-CM | POA: Diagnosis not present

## 2017-02-28 DIAGNOSIS — G43019 Migraine without aura, intractable, without status migrainosus: Secondary | ICD-10-CM | POA: Diagnosis not present

## 2017-03-14 ENCOUNTER — Emergency Department (HOSPITAL_COMMUNITY)
Admission: EM | Admit: 2017-03-14 | Discharge: 2017-03-14 | Disposition: A | Discharge status: Home / Self Care | Payer: BLUE CROSS/BLUE SHIELD | Attending: Emergency Medicine | Admitting: Emergency Medicine

## 2017-03-14 ENCOUNTER — Emergency Department (HOSPITAL_COMMUNITY): Payer: BLUE CROSS/BLUE SHIELD

## 2017-03-14 ENCOUNTER — Encounter (HOSPITAL_COMMUNITY): Payer: Self-pay | Admitting: Emergency Medicine

## 2017-03-14 DIAGNOSIS — R079 Chest pain, unspecified: Secondary | ICD-10-CM | POA: Diagnosis not present

## 2017-03-14 DIAGNOSIS — R197 Diarrhea, unspecified: Secondary | ICD-10-CM | POA: Diagnosis not present

## 2017-03-14 DIAGNOSIS — R0789 Other chest pain: Secondary | ICD-10-CM

## 2017-03-14 LAB — CBC
HCT: 43.2 % (ref 36.0–46.0)
Hemoglobin: 14.1 g/dL (ref 12.0–15.0)
MCH: 28.7 pg (ref 26.0–34.0)
MCHC: 32.6 g/dL (ref 30.0–36.0)
MCV: 88 fL (ref 78.0–100.0)
Platelets: 194 10*3/uL (ref 150–400)
RBC: 4.91 MIL/uL (ref 3.87–5.11)
RDW: 13.9 % (ref 11.5–15.5)
WBC: 9.2 10*3/uL (ref 4.0–10.5)

## 2017-03-14 LAB — BASIC METABOLIC PANEL
Anion gap: 9 (ref 5–15)
BUN: 17 mg/dL (ref 6–20)
CO2: 20 mmol/L — ABNORMAL LOW (ref 22–32)
Calcium: 8.3 mg/dL — ABNORMAL LOW (ref 8.9–10.3)
Chloride: 104 mmol/L (ref 101–111)
Creatinine, Ser: 0.61 mg/dL (ref 0.44–1.00)
GFR calc Af Amer: >60 mL/min (ref >60)
GFR calc non Af Amer: >60 mL/min (ref >60)
Glucose, Bld: 135 mg/dL — ABNORMAL HIGH (ref 65–99)
Potassium: 3.4 mmol/L — ABNORMAL LOW (ref 3.5–5.1)
Sodium: 133 mmol/L — ABNORMAL LOW (ref 135–145)

## 2017-03-14 LAB — I-STAT TROPONIN, ED
Troponin i, poc: 0 ng/mL (ref 0.00–0.08)
Troponin i, poc: 0 ng/mL (ref 0.00–0.08)

## 2017-03-14 MED ORDER — DIPHENOXYLATE-ATROPINE 2.5-0.025 MG PO TABS: 1.0000 | ORAL_TABLET | Freq: Four times a day (QID) | ORAL | 0 refills | Status: DC | PRN

## 2017-03-14 MED ORDER — SODIUM CHLORIDE 0.9 % IV BOLUS (SEPSIS)
1000.0000 mL | Freq: Once | INTRAVENOUS | Status: AC
  Administered 2017-03-14: 1000 mL via INTRAVENOUS

## 2017-03-14 MED ORDER — METOCLOPRAMIDE HCL 5 MG/ML IJ SOLN
10.0000 mg | Freq: Once | INTRAMUSCULAR | Status: AC
  Administered 2017-03-14: 10 mg via INTRAVENOUS
  Filled 2017-03-14: qty 2

## 2017-03-14 MED ORDER — KETOROLAC TROMETHAMINE 30 MG/ML IJ SOLN
30.0000 mg | Freq: Once | INTRAMUSCULAR | Status: AC
  Administered 2017-03-14: 30 mg via INTRAVENOUS
  Filled 2017-03-14: qty 1

## 2017-03-14 MED ORDER — ONDANSETRON HCL 4 MG PO TABS: 4.0000 mg | ORAL_TABLET | Freq: Four times a day (QID) | ORAL | 0 refills | Status: DC

## 2017-03-14 NOTE — ED Notes (Signed)
Dr. Betsey Holiday updated on pt.'s hypotension .

## 2017-03-14 NOTE — ED Notes (Signed)
Patient taking Excedrin for h/a

## 2017-03-14 NOTE — Discharge Instructions (Addendum)
Do not take your clonidine for the next 1-2 days, call your doctor to see when you can restart this due to your low blood pressure

## 2017-03-14 NOTE — ED Triage Notes (Signed)
Pt c/o 8/10 central cp that started today a 3 am. Pt states she is been getting nausea, vomiting diarrhea since yesterday after work and today she woke up with pressure on the center of her chest, no fever or chills.

## 2017-03-14 NOTE — ED Provider Notes (Signed)
10:14 AM patient is feeling much better. She has walked to the bathroom multiple times without dizziness or lightheadedness. Her blood pressure has been low, mostly in the 90s. Typically she already has low blood pressure in the low 100s. When I readjusted her cuff, it is now 109/86. After discussion with patient, discussed observation versus discharge home with close follow-up, she prefers to go home. I don't think she truly has hypotension and she overall appears well. She takes clonidine daily for hot flashes. I have told her to hold this over the next day or so and follow-up closely with her PCP.   Sherwood Gambler, MD 03/14/17 [phone]

## 2017-03-14 NOTE — ED Provider Notes (Signed)
[facility] DEPT Provider Note   CSN: [id number] Arrival date & time: 03/14/17  1245     History   Chief Complaint Chief Complaint  Patient presents with  . Chest Pain  . Emesis  . Diarrhea    HPI Kelly Francis is a 61 y.o. female.  Patient presents to the emergency para for evaluation of nausea with diarrhea. Patient reports that she got sick while she was at work. She felt very nauseated but did not vomit. She went home and had multiple episodes of diarrhea. Overnight, however, she developed chest pain. She reports a sharp and constant pain in the chest that has since subsided. She is feeling much better by time of evaluation in the ER.      Past Medical History:  Diagnosis Date  . Dyslipidemia    mild, high HDL  . Glaucoma    very mild  . History of kidney stones   . History of pneumonia 1990  . Kidney stones   . Migraine    frequent, [facility]  . 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 Vitamin B12 deficiency 02/2011   s/p 1 year injections, now oral replacement    Patient Active Problem List   Diagnosis Date Noted  . Postmenopausal hormone replacement therapy 12/01/2016  . Hot flash, menopausal 12/01/2016  . Overweight (BMI 25.0-29.9) 08/26/2014  . Pain of both heels 03/12/2014  . Dyslipidemia   . Healthcare maintenance 03/01/2011  . Environmental allergies 03/01/2011  . Fatigue 02/02/2011  . Glaucoma   . Kidney stones   . Common migraine     Past Surgical History:  Procedure Laterality Date  . CHOLECYSTECTOMY  1994  . EXPLORATORY LAPAROTOMY  1998   Removed ovarian cysts and fibroids  . VAGINAL HYSTERECTOMY  1999   for fibroids/endometriosis, ovaries remain  . VEIN LIGATION AND STRIPPING  2012   Dr. Hulda Humphrey    OB History    No data available       Home Medications    Prior to Admission medications   Medication Sig Start Date End Date Taking? Authorizing Provider  cetirizine (ZYRTEC) 10 MG tablet Take 10 mg by mouth  daily as needed for allergies.   Yes Historical Provider, MD  cloNIDine (CATAPRES) 0.1 MG tablet Take 0.1 mg by mouth daily. For hot flashes    Yes Historical Provider, MD  [facility] VITAMIN B-12 500 MCG tablet TAKE 1 TABLET BY MOUTH EVERY DAY 09/20/15  Yes [name], MD  EPINEPHrine (EPIPEN) 0.3 mg/0.3 mL IJ SOAJ injection Inject 0.3 mLs (0.3 mg total) into the muscle once. Patient taking differently: Inject 0.3 mg into the muscle daily as needed (allergic reaction).  06/25/14  Yes [name], MD  estradiol (ESTRACE) 1 MG tablet Take 1 mg by mouth daily.     Yes Historical Provider, MD  fexofenadine (ALLEGRA) 60 MG tablet Take 30 mg by mouth daily as needed for allergies or rhinitis.   Yes Historical Provider, MD  nitroGLYCERIN (NITROSTAT) 0.4 MG SL tablet Place 1 tablet (0.4 mg total) under the tongue every 5 (five) minutes as needed for chest pain. 08/28/14  Yes Minna Merritts, MD  omeprazole (PRILOSEC) 40 MG capsule TAKE 1 CAPSULE (40 MG TOTAL) BY MOUTH DAILY. [place]. Patient taking differently: Take 40 mg by mouth daily as needed (acid reflux).  04/28/16  Yes [name], MD  topiramate (TOPAMAX) 25 MG tablet Take 25 mg by mouth at bedtime.     Yes  Historical Provider, MD    Family History Family History  Problem Relation Age of Onset  . Prostate cancer Paternal Grandfather   . Heart attack Paternal Grandfather 71    deceased, also strong family hx CAD/MI  . Diabetes Mother   . Thyroid disease Mother   . Lung cancer Paternal Uncle   . Heart disease Other     strong family hx (cousin dec 49, PGF dec 55)  . Hyperlipidemia    . Atrial fibrillation Brother     with MVP and PVCs  . Heart attack Cousin 46    sudden death  . Heart attack Paternal Uncle   . Melanoma Sister   . Crohn's disease Sister   . Thyroid disease Sister     Social History Social History  Substance Use Topics  . Smoking status: Never Smoker  . Smokeless tobacco: Never Used  .  Alcohol use Yes     Comment: Socially     Allergies   Yellow jacket venom [bee venom]   Review of Systems Review of Systems  Cardiovascular: Positive for chest pain.  Gastrointestinal: Positive for diarrhea and nausea.  All other systems reviewed and are negative.    Physical Exam Updated Vital Signs BP 98/62   Pulse 86   Temp 98.3 F (36.8 C) (Oral)   Resp 17   Ht 5' 3.5" (1.613 m)   Wt 160 lb (72.6 kg)   SpO2 96%   BMI 27.90 kg/m   Physical Exam  Constitutional: She is oriented to person, place, and time. She appears well-developed and well-nourished. No distress.  HENT:  Head: Normocephalic and atraumatic.  Right Ear: Hearing normal.  Left Ear: Hearing normal.  Nose: Nose normal.  Mouth/Throat: Oropharynx is clear and moist and mucous membranes are normal.  Eyes: Conjunctivae and EOM are normal. Pupils are equal, round, and reactive to light.  Neck: Normal range of motion. Neck supple.  Cardiovascular: Regular rhythm, S1 normal and S2 normal.  Exam reveals no gallop and no friction rub.   No murmur heard. Pulmonary/Chest: Effort normal and breath sounds normal. No respiratory distress. She exhibits no tenderness.  Abdominal: Soft. Normal appearance and bowel sounds are normal. There is no hepatosplenomegaly. There is no tenderness. There is no rebound, no guarding, no tenderness at McBurney's point and negative Murphy's sign. No hernia.  Musculoskeletal: Normal range of motion.  Neurological: She is alert and oriented to person, place, and time. She has normal strength. No cranial nerve deficit or sensory deficit. Coordination normal. GCS eye subscore is 4. GCS verbal subscore is 5. GCS motor subscore is 6.  Skin: Skin is warm, dry and intact. No rash noted. No cyanosis.  Psychiatric: She has a normal mood and affect. Her speech is normal and behavior is normal. Thought content normal.  Nursing note and vitals reviewed.    ED Treatments / Results  Labs (all  labs ordered are listed, but only abnormal results are displayed) Labs Reviewed  BASIC METABOLIC PANEL - Abnormal; Notable for the following:       Result Value   Sodium 133 (*)    Potassium 3.4 (*)    CO2 20 (*)    Glucose, Bld 135 (*)    Calcium 8.3 (*)    All other components within normal limits  CBC  I-STAT TROPOININ, ED    EKG  EKG Interpretation  Date/Time:  Wednesday Mar 14 2017 04:25:39 EDT Ventricular Rate:  96 PR Interval:  136 QRS Duration: 82  QT Interval:  356 QTC Calculation: 449 R Axis:   92 Text Interpretation:  Normal sinus rhythm Rightward axis Cannot rule out Anterior infarct , age undetermined Abnormal ECG No significant change since last tracing Confirmed by [facility]  MD, Airanna Partin [phone]) on 03/14/2017 5:09:27 AM       Radiology Dg Chest 2 View  Result Date: 03/14/2017 CLINICAL DATA:  Midchest pain tonight EXAM: CHEST  2 VIEW COMPARISON:  08/25/2014 FINDINGS: The lungs are clear. The pulmonary vasculature is normal. Heart size is normal. Hilar and mediastinal contours are unremarkable. There is no pleural effusion. IMPRESSION: No active cardiopulmonary disease. Electronically Signed   By: Andreas Newport M.D.   On: 03/14/2017 05:17    Procedures Procedures (including critical care time)  Medications Ordered in ED Medications  sodium chloride 0.9 % bolus 1,000 mL (0 mLs Intravenous Stopped 03/14/17 0656)  metoCLOPramide (REGLAN) injection 10 mg (10 mg Intravenous Given 03/14/17 0542)  ketorolac (TORADOL) 30 MG/ML injection 30 mg (30 mg Intravenous Given 03/14/17 0542)     Initial Impression / Assessment and Plan / ED Course  I have reviewed the triage vital signs and the nursing notes.  Pertinent labs & imaging results that were available during my care of the patient were reviewed by me and considered in my medical decision making (see chart for details).     Patient appears well at arrival. She reports that she felt quite poorly at home. She had  multiple episodes of diarrhea, severe nausea without vomiting. She then developed abdominal discomfort followed by chest discomfort. She does not have any known coronary artery disease. Symptoms are very atypical for cardiac etiology. She also has had resolution without intervention prior to my evaluation. Patient given IV fluids and antiemetics here in the ER. Initial troponin and EKG were unremarkable. Patient held in the ER, second troponin performed and is negative. Patient felt to be low risk for cardiac etiology, appropriate for discharge and continued symptomatically for GI distress.  Final Clinical Impressions(s) / ED Diagnoses   Final diagnoses:  Atypical chest pain  Diarrhea, unspecified type    New Prescriptions New Prescriptions   No medications on file     Orpah Greek, MD 03/14/17 [phone]

## 2017-03-23 ENCOUNTER — Ambulatory Visit (INDEPENDENT_AMBULATORY_CARE_PROVIDER_SITE_OTHER): Payer: BLUE CROSS/BLUE SHIELD | Admitting: Family Medicine

## 2017-03-23 ENCOUNTER — Encounter: Payer: Self-pay | Admitting: Family Medicine

## 2017-03-23 VITALS — BP 124/84 | HR 98 | Temp 98.6°F | Ht 62.5 in | Wt 169.5 lb

## 2017-03-23 DIAGNOSIS — R0789 Other chest pain: Secondary | ICD-10-CM

## 2017-03-23 DIAGNOSIS — Z79890 Hormone replacement therapy (postmenopausal): Secondary | ICD-10-CM | POA: Diagnosis not present

## 2017-03-23 DIAGNOSIS — N951 Menopausal and female climacteric states: Secondary | ICD-10-CM | POA: Diagnosis not present

## 2017-03-23 NOTE — Progress Notes (Signed)
BP 124/84 (BP Location: Right Arm, Cuff Size: Normal)   Pulse 98   Temp 98.6 F (37 C) (Oral)   Ht 5' 2.5" (1.588 m)   Wt 169 lb 8 oz (76.9 kg)   BMI 30.51 kg/m    CC: ED f/u visit Subjective:    Patient ID: Kelly Francis, female    DOB: 1956-07-28, 61 y.o.   MRN: [id number]  HPI: Kelly Francis is a 61 y.o. female presenting on 03/23/2017 for Follow-up (ED-Chest Pain)   ER visit 03/14/2017 initially with nausea/diarrhea that progressed to chest pain described as sharp and constant pain. Records reviewed. TnI negative x2. EKG was without acute changes compared to prior. Found to be hypertensive - attributed to clonidine that she was taking daily for hot flashes. She stopped clonidine - and hot flashes have been stable.   Symptoms started at work at 6pm - nausea. She decided to go home. That night while in bed she had chest pressure/tightness that lasted 10-20 minutes. This was associated with dyspnea and marked weakness. At that time called family who took her to ER for evaluation.   Relevant past medical, surgical, family and social history reviewed and updated as indicated. Interim medical history since our last visit reviewed. Allergies and medications reviewed and updated. Outpatient Medications Prior to Visit  Medication Sig Dispense Refill  . cetirizine (ZYRTEC) 10 MG tablet Take 10 mg by mouth daily as needed for allergies.    . [facility] VITAMIN B-12 500 MCG tablet TAKE 1 TABLET BY MOUTH EVERY DAY 100 tablet 3  . diphenoxylate-atropine (LOMOTIL) 2.5-0.025 MG tablet Take 1 tablet by mouth 4 (four) times daily as needed for diarrhea or loose stools. 30 tablet 0  . EPINEPHrine (EPIPEN) 0.3 mg/0.3 mL IJ SOAJ injection Inject 0.3 mLs (0.3 mg total) into the muscle once. (Patient taking differently: Inject 0.3 mg into the muscle daily as needed (allergic reaction). ) 2 Device 0  . estradiol (ESTRACE) 1 MG tablet Take 1 mg by mouth daily.      . fexofenadine (ALLEGRA) 60 MG tablet Take 30 mg  by mouth daily as needed for allergies or rhinitis.    Marland Kitchen nitroGLYCERIN (NITROSTAT) 0.4 MG SL tablet Place 1 tablet (0.4 mg total) under the tongue every 5 (five) minutes as needed for chest pain. 25 tablet 3  . omeprazole (PRILOSEC) 40 MG capsule TAKE 1 CAPSULE (40 MG TOTAL) BY MOUTH DAILY. [place]. (Patient taking differently: Take 40 mg by mouth daily as needed (acid reflux). ) 30 capsule 6  . ondansetron (ZOFRAN) 4 MG tablet Take 1 tablet (4 mg total) by mouth every 6 (six) hours. 12 tablet 0  . topiramate (TOPAMAX) 25 MG tablet Take 25 mg by mouth at bedtime.      . cloNIDine (CATAPRES) 0.1 MG tablet Take 0.1 mg by mouth daily. For hot flashes      No facility-administered medications prior to visit.      Per HPI unless specifically indicated in ROS section below Review of Systems     Objective:    BP 124/84 (BP Location: Right Arm, Cuff Size: Normal)   Pulse 98   Temp 98.6 F (37 C) (Oral)   Ht 5' 2.5" (1.588 m)   Wt 169 lb 8 oz (76.9 kg)   BMI 30.51 kg/m   Wt Readings from Last 3 Encounters:  03/23/17 169 lb 8 oz (76.9 kg)  03/14/17 160 lb (72.6 kg)  12/01/16 161 lb (73 kg)  BP Readings from Last 3 Encounters:  03/23/17 124/84  03/14/17 101/65  12/01/16 110/80    Physical Exam  Constitutional: She appears well-developed and well-nourished. No distress.  HENT:  Head: Normocephalic and atraumatic.  Mouth/Throat: Oropharynx is clear and moist. No oropharyngeal exudate.  Eyes: Conjunctivae are normal. Pupils are equal, round, and reactive to light. No scleral icterus.  Neck: Normal range of motion. Neck supple. No thyromegaly present.  Cardiovascular: Normal rate, regular rhythm, normal heart sounds and intact distal pulses.   No murmur heard. Pulmonary/Chest: Effort normal and breath sounds normal. No respiratory distress. She has no wheezes. She has no rales.  Musculoskeletal: She exhibits no edema.  Lymphadenopathy:    She has no cervical  adenopathy.  Skin: Skin is warm and dry. No rash noted.  Psychiatric: She has a normal mood and affect.  Nursing note and vitals reviewed.  Results for orders placed or performed during the hospital encounter of 67/12/45  Basic metabolic panel  Result Value Ref Range   Sodium 133 (L) 135 - 145 mmol/L   Potassium 3.4 (L) 3.5 - 5.1 mmol/L   Chloride 104 101 - 111 mmol/L   CO2 20 (L) 22 - 32 mmol/L   Glucose, Bld 135 (H) 65 - 99 mg/dL   BUN 17 6 - 20 mg/dL   Creatinine, Ser 0.61 0.44 - 1.00 mg/dL   Calcium 8.3 (L) 8.9 - 10.3 mg/dL   GFR calc non Af Amer >60 >60 mL/min   GFR calc Af Amer >60 >60 mL/min   Anion gap 9 5 - 15  CBC  Result Value Ref Range   WBC 9.2 4.0 - 10.5 K/uL   RBC 4.91 3.87 - 5.11 MIL/uL   Hemoglobin 14.1 12.0 - 15.0 g/dL   HCT 43.2 36.0 - 46.0 %   MCV 88.0 78.0 - 100.0 fL   MCH 28.7 26.0 - 34.0 pg   MCHC 32.6 30.0 - 36.0 g/dL   RDW 13.9 11.5 - 15.5 %   Platelets 194 150 - 400 K/uL  I-stat troponin, ED  Result Value Ref Range   Troponin i, poc 0.00 0.00 - 0.08 ng/mL   Comment 3          I-stat troponin, ED  Result Value Ref Range   Troponin i, poc 0.00 0.00 - 0.08 ng/mL   Comment 3              Assessment & Plan:   Problem List Items Addressed This Visit    Hot flash, menopausal    She has stopped clonidine, and hot flashes have not worsened.       Other chest pain - Primary    Overall atypical chest pain - not exertional, not relieved by rest. However given extensive family history I did recommend re eval by cardiology. Latest treadmill stress test reassuring 2015.  ER records reviewed.      Relevant Orders   Ambulatory referral to Cardiology   Postmenopausal hormone replacement therapy    On estradiol per GYN. Encouraged to decrease dose from 1mg  daily to 0.5mg  daily - she will try this.           Follow up plan: No Follow-up on file.  [name], MD

## 2017-03-23 NOTE — Patient Instructions (Addendum)
I agree with stopping clonidine.  Try 0.5mg  estradiol in place of 1mg  Let's refer you back to Dr Rockey Situ.  Continue to drink plenty of water.

## 2017-03-23 NOTE — Progress Notes (Signed)
Pre visit review using our clinic review tool, if applicable. No additional management support is needed unless otherwise documented below in the visit note. 

## 2017-03-24 NOTE — Assessment & Plan Note (Signed)
On estradiol per GYN. Encouraged to decrease dose from 1mg  daily to 0.5mg  daily - she will try this.

## 2017-03-24 NOTE — Assessment & Plan Note (Signed)
She has stopped clonidine, and hot flashes have not worsened.

## 2017-03-24 NOTE — Assessment & Plan Note (Signed)
Overall atypical chest pain - not exertional, not relieved by rest. However given extensive family history I did recommend re eval by cardiology. Latest treadmill stress test reassuring 2015.  ER records reviewed.

## 2017-04-18 ENCOUNTER — Ambulatory Visit (INDEPENDENT_AMBULATORY_CARE_PROVIDER_SITE_OTHER): Payer: BLUE CROSS/BLUE SHIELD | Admitting: Nurse Practitioner

## 2017-04-18 ENCOUNTER — Encounter: Payer: Self-pay | Admitting: Nurse Practitioner

## 2017-04-18 VITALS — BP 110/80 | HR 87 | Ht 63.5 in | Wt 174.5 lb

## 2017-04-18 DIAGNOSIS — M79605 Pain in left leg: Secondary | ICD-10-CM | POA: Diagnosis not present

## 2017-04-18 DIAGNOSIS — R072 Precordial pain: Secondary | ICD-10-CM | POA: Diagnosis not present

## 2017-04-18 DIAGNOSIS — M79604 Pain in right leg: Secondary | ICD-10-CM

## 2017-04-18 NOTE — Patient Instructions (Addendum)
Medication Instructions:  Your physician recommends that you continue on your current medications as directed. Please refer to the Current Medication list given to you today.   Labwork: none  Testing/Procedures: Your physician has requested that you have a lower extremity arterial exercise duplex. During this test, exercise and ultrasound are used to evaluate arterial blood flow in the legs. Allow one hour for this exam. There are no restrictions or special instructions. Your physician has requested that you have an ankle brachial index (ABI). During this test an ultrasound and blood pressure cuff are used to evaluate the arteries that supply the arms and legs with blood. Allow thirty minutes for this exam. There are no restrictions or special instructions.  Your physician has requested that you have a lexiscan myoview. For further information please visit [email or web address]. Please follow instruction sheet, as given.  [place]  Your caregiver has ordered a Stress Test with nuclear imaging. The purpose of this test is to evaluate the blood supply to your heart muscle. This procedure is referred to as a "Non-Invasive Stress Test." This is because other than having an IV started in your vein, nothing is inserted or "invades" your body. Cardiac stress tests are done to find areas of poor blood flow to the heart by determining the extent of coronary artery disease (CAD). Some patients exercise on a treadmill, which naturally increases the blood flow to your heart, while others who are  unable to walk on a treadmill due to physical limitations have a pharmacologic/chemical stress agent called Lexiscan . This medicine will mimic walking on a treadmill by temporarily increasing your coronary blood flow.   Please note: these test may take anywhere between 2-4 hours to complete  PLEASE REPORT TO [place] TO GO  Date of  Procedure: Wednesday, June 13  Arrival Time for Procedure: 7:45am Instructions regarding medication:   You may take all of your morning medications with a sip of water.  PLEASE NOTIFY THE OFFICE AT LEAST 15 HOURS IN ADVANCE IF YOU ARE UNABLE TO KEEP YOUR APPOINTMENT.  [phone] AND  PLEASE NOTIFY NUCLEAR MEDICINE AT [facility] AT LEAST 24 HOURS IN ADVANCE IF YOU ARE UNABLE TO KEEP YOUR APPOINTMENT. [phone]  How to prepare for your Myoview test:  1. Do not eat or drink after midnight 2. No caffeine for 24 hours prior to test 3. No smoking 24 hours prior to test. 4. Your medication may be taken with water.  If your doctor stopped a medication because of this test, do not take that medication. 5. Ladies, please do not wear dresses.  Skirts or pants are appropriate. Please wear a short sleeve shirt. 6. No perfume, cologne or lotion. 7. Wear comfortable walking shoes. No heels!            Follow-Up: Your physician recommends that you schedule a follow-up appointment as needed.    Any Other Special Instructions Will Be Listed Below (If Applicable).     If you need a refill on your cardiac medications before your next appointment, please call your pharmacy.  Cardiac Nuclear Scan A cardiac nuclear scan is a test that measures blood flow to the heart when a person is resting and when he or she is exercising. The test looks for problems such as:  Not enough blood reaching a portion of the heart.  The heart muscle not working normally.  You may need this test if:  You have heart disease.  You have had abnormal lab results.  You have had heart surgery or angioplasty.  You have chest pain.  You have shortness of breath.  In this test, a radioactive dye (tracer) is injected into your bloodstream. After the tracer has traveled to your heart, an imaging device is used to measure how much of the tracer is absorbed by or distributed to various areas of your heart. This  procedure is usually done at a hospital and takes 2-4 hours. Tell a health care provider about:  Any allergies you have.  All medicines you are taking, including vitamins, herbs, eye drops, creams, and over-the-counter medicines.  Any problems you or family members have had with the use of anesthetic medicines.  Any blood disorders you have.  Any surgeries you have had.  Any medical conditions you have.  Whether you are pregnant or may be pregnant. What are the risks? Generally, this is a safe procedure. However, problems may occur, including:  Serious chest pain and heart attack. This is only a risk if the stress portion of the test is done.  Rapid heartbeat.  Sensation of warmth in your chest. This usually passes quickly.  What happens before the procedure?  Ask your health care provider about changing or stopping your regular medicines. This is especially important if you are taking diabetes medicines or blood thinners.  Remove your jewelry on the day of the procedure. What happens during the procedure?  An IV tube will be inserted into one of your veins.  Your health care provider will inject a small amount of radioactive tracer through the tube.  You will wait for 20-40 minutes while the tracer travels through your bloodstream.  Your heart activity will be monitored with an electrocardiogram (ECG).  You will lie down on an exam table.  Images of your heart will be taken for about 15-20 minutes.  You may be asked to exercise on a treadmill or stationary bike. While you exercise, your heart's activity will be monitored with an ECG, and your blood pressure will be checked. If you are unable to exercise, you may be given a medicine to increase blood flow to parts of your heart.  When blood flow to your heart has peaked, a tracer will again be injected through the IV tube.  After 20-40 minutes, you will get back on the exam table and have more images taken of your  heart.  When the procedure is over, your IV tube will be removed. The procedure may vary among health care providers and hospitals. Depending on the type of tracer used, scans may need to be repeated 3-4 hours later. What happens after the procedure?  Unless your health care provider tells you otherwise, you may return to your normal schedule, including diet, activities, and medicines.  Unless your health care provider tells you otherwise, you may increase your fluid intake. This will help flush the contrast dye from your body. Drink enough fluid to keep your urine clear or pale yellow.  It is up to you to get your test results. Ask your health care provider, or the department that is doing the test, when your results will be ready. Summary  A cardiac nuclear scan measures the blood flow to the heart when a person is resting and when he or she is exercising.  You may need this test if you are at risk for heart disease.  Tell your health care provider if you are pregnant.  Unless  your health care provider tells you otherwise, increase your fluid intake. This will help flush the contrast dye from your body. Drink enough fluid to keep your urine clear or pale yellow. This information is not intended to replace advice given to you by your health care provider. Make sure you discuss any questions you have with your health care provider. Document Released: 11/24/2004 Document Revised: 11/01/2016 Document Reviewed: 10/08/2013 Elsevier Interactive Patient Education  2017 [employer].

## 2017-04-18 NOTE — Progress Notes (Signed)
Office Visit    Patient Name: Kelly Francis Date of Encounter: 04/18/2017  Primary Care Provider:  [name], MD Primary Cardiologist:  Johnny Bridge, MD   Chief Complaint    61 y/o ? with a h/o chest pain, obesity, and HL, who presents For evaluation related to recurrent chest discomfort.  Past Medical History    Past Medical History:  Diagnosis Date  . Chest pain    a. 2006 Neg MV;  b. 08/2014 ETT: Ex time 9:00, Max HR 166 (101%), BP 210/115, no c/p or ecg changes.  . Dyslipidemia    mild, high HDL  . Glaucoma    very mild  . History of kidney stones   . History of pneumonia 1990  . Kidney stones   . Leg pain   . Migraine    frequent, [facility]  . 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 Vitamin B12 deficiency 02/2011   s/p 1 year injections, now oral replacement   Past Surgical History:  Procedure Laterality Date  . CHOLECYSTECTOMY  1994  . EXPLORATORY LAPAROTOMY  1998   Removed ovarian cysts and fibroids  . VAGINAL HYSTERECTOMY  1999   for fibroids/endometriosis, ovaries remain  . VEIN LIGATION AND STRIPPING  2012   Dr. Hulda Humphrey    Allergies  Allergies  Allergen Reactions  . Yellow Jacket Venom [Bee Venom] Other (See Comments)    Local reaction - swelling, erythema, pain    History of Present Illness    61 year old female with the above PMH including chest pain with neg stress testing in 2006 and again in 2015.  Other hx includes obesity, HL, and FH of CAD (paternal grandfather with MI @ 32).  She was last seen in clinic here in 08/2014 following an episode of atypical c/p, for which she underwent ETT, showing good exercise tolerance and hypertensive response to exercise.  This was a nl study w/o ecg changes.  She lives locally by herself and takes care of her yard on her own.  She has no trouble push mowing but recently has noted leg fatigue, cramping, and discomfort in bilateral legs.  She is concerned that she may be developing PVD.     In late May, she developed nausea and diarrhea with profound weakness one evening after work.  This persisted all night long and at one point she developed profound chest heaviness, which only lasted a few mins and resolved spontaneously.  A family member brought her to the emergency room where she was found to be hypotensive. EKG and troponins were normal. She was treated with IV fluids with resolution of symptoms and subsequently discharged. She since followed up with primary care with recommendation for cardiology follow-up. She's had no recurrence of symptoms. She has not any significant reduction in exercise tolerance. She denies palpitations, PND, orthopnea, dizziness, syncope, edema, or early satiety. She is interested in repeat stress testing.  Home Medications    Prior to Admission medications   Medication Sig Start Date End Date Taking? Authorizing Provider  cetirizine (ZYRTEC) 10 MG tablet Take 10 mg by mouth daily as needed for allergies.   Yes [provider]  [facility] VITAMIN B-12 500 MCG tablet TAKE 1 TABLET BY MOUTH EVERY DAY 09/20/15  Yes [name], MD  EPINEPHrine (EPIPEN) 0.3 mg/0.3 mL IJ SOAJ injection Inject 0.3 mLs (0.3 mg total) into the muscle once. Patient taking differently: Inject 0.3 mg into the muscle daily as needed (allergic reaction).  06/25/14  Yes [name], MD  estradiol (ESTRACE) 1 MG tablet Take 1 mg by mouth daily.     Yes [provider]  fexofenadine (ALLEGRA) 60 MG tablet Take 30 mg by mouth daily as needed for allergies or rhinitis.   Yes [provider]  nitroGLYCERIN (NITROSTAT) 0.4 MG SL tablet Place 1 tablet (0.4 mg total) under the tongue every 5 (five) minutes as needed for chest pain. 08/28/14  Yes Gollan, Kathlene November, MD  omeprazole (PRILOSEC) 40 MG capsule TAKE 1 CAPSULE (40 MG TOTAL) BY MOUTH DAILY. [place]. Patient taking differently: Take 40 mg by mouth daily as needed (acid reflux).  04/28/16  Yes  [name], MD  ondansetron (ZOFRAN) 4 MG tablet Take 1 tablet (4 mg total) by mouth every 6 (six) hours. 03/14/17  Yes Pollina, Gwenyth Allegra, MD  topiramate (TOPAMAX) 25 MG tablet Take 25 mg by mouth at bedtime.     Yes [provider]    Review of Systems    As above, she's been having bilateral leg discomfort at rest and also with ambulation. She also had an episode of chest discomfort in the setting of nausea, diarrhea, and low blood pressures-for which she was seen in the ER.  All other systems reviewed and are otherwise negative except as noted above.  Physical Exam    VS:  BP 110/80 (BP Location: Left Arm, Patient Position: Sitting, Cuff Size: Normal)   Pulse 87   Ht 5' 3.5" (1.613 m)   Wt 174 lb 8 oz (79.2 kg)   BMI 30.43 kg/m  , BMI Body mass index is 30.43 kg/m. GEN: Well nourished, well developed, in no acute distress.  HEENT: normal.  Neck: Supple, no JVD, carotid bruits, or masses. Cardiac: RRR, no murmurs, rubs, or gallops. No clubbing, cyanosis, edema.  Radials/DP/PT 2+ and equal bilaterally.  Respiratory:  Respirations regular and unlabored, clear to auscultation bilaterally. GI: Soft, nontender, nondistended, BS + x 4. MS: no deformity or atrophy. Skin: warm and dry, no rash. Neuro:  Strength and sensation are intact. Psych: Normal affect.  Accessory Clinical Findings    ECG - Reveals sinus rhythm, 87, left axis deviation, delayed R-wave progression similar to EKG in the emergency department last week.  Assessment & Plan    1.  Precordial chest pain: Patient recently had an episode of severe substernal chest heaviness and pressure in the setting of nausea and diarrhea as well as profound weakness and hypotension. Of note, at the time she was on clonidine 0.1 mg daily for postmenopausal symptoms. She did take her clonidine despite feeling poorly the evening before ER presentation. Symptoms of chest pressure lasted about 10-15 minutes and resolved  spontaneously. EKG was nonacute and troponins were normal. She was treated with IV fluids with resolution of weakness and nausea. She has had no recurrence of symptoms. I will arrange for an exercise Myoview to rule out ischemia. Provided that this is normal, she can likely follow-up as needed.  2. Bilateral lower extremity pain/claudication: Patient reports bilateral leg pain that is intermittent throughout the day at rest and does seem to worsen with activity. She is interested in testing for peripheral vascular disease and I will arrange for arterial brachial indices.  3. Disposition: Follow-up ABIs and stress testing. We will determine need for follow-up based on test results.   Murray Hodgkins, NP 04/18/2017, 3:37 PM

## 2017-05-03 ENCOUNTER — Other Ambulatory Visit: Payer: Self-pay | Admitting: Nurse Practitioner

## 2017-05-03 ENCOUNTER — Encounter: Payer: Self-pay | Admitting: Family Medicine

## 2017-05-03 ENCOUNTER — Ambulatory Visit (INDEPENDENT_AMBULATORY_CARE_PROVIDER_SITE_OTHER): Payer: BLUE CROSS/BLUE SHIELD | Admitting: Family Medicine

## 2017-05-03 VITALS — BP 110/74 | HR 85 | Temp 98.3°F | Ht 63.5 in

## 2017-05-03 DIAGNOSIS — E785 Hyperlipidemia, unspecified: Secondary | ICD-10-CM

## 2017-05-03 DIAGNOSIS — Z0000 Encounter for general adult medical examination without abnormal findings: Secondary | ICD-10-CM | POA: Diagnosis not present

## 2017-05-03 DIAGNOSIS — Z79890 Hormone replacement therapy (postmenopausal): Secondary | ICD-10-CM

## 2017-05-03 DIAGNOSIS — E669 Obesity, unspecified: Secondary | ICD-10-CM

## 2017-05-03 DIAGNOSIS — R072 Precordial pain: Secondary | ICD-10-CM

## 2017-05-03 DIAGNOSIS — E66811 Obesity, class 1: Secondary | ICD-10-CM

## 2017-05-03 DIAGNOSIS — Z1211 Encounter for screening for malignant neoplasm of colon: Secondary | ICD-10-CM

## 2017-05-03 DIAGNOSIS — I739 Peripheral vascular disease, unspecified: Secondary | ICD-10-CM

## 2017-05-03 DIAGNOSIS — N951 Menopausal and female climacteric states: Secondary | ICD-10-CM

## 2017-05-03 NOTE — Assessment & Plan Note (Signed)
Off clonidine, continues HRT, tapering off. Not interested in SSRI. Discussed further supportive measures.

## 2017-05-03 NOTE — Assessment & Plan Note (Signed)
Preventative protocols reviewed and updated unless pt declined. Discussed healthy diet and lifestyle.  

## 2017-05-03 NOTE — Assessment & Plan Note (Addendum)
Overall atypical. Reviewed recent cards note and EKG - poor R wave progression. She was recommended myoview stress test but has decided against this due to cost. Prior treadmill stress test reassuring 2015.
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
## 2017-05-03 NOTE — Assessment & Plan Note (Signed)
Weight gain discussed. Pt requested phentermine - I recommended against this due to recent cardiac scare. She stated she would just get OTC weight loss supplement. I also recommended against this. Recommended healthy diet and exercise changes to affect sustainable weight loss

## 2017-05-03 NOTE — Assessment & Plan Note (Signed)
Last FLP stable. Update FLP this year.

## 2017-05-03 NOTE — Patient Instructions (Addendum)
Labs today Pass by lab to pick up stool kit. You are doing well today.  Return as needed or in 1 year for next physical.   Health Maintenance for Postmenopausal Women Menopause is a normal process in which your reproductive ability comes to an end. This process happens gradually over a span of months to years, usually between the ages of 70 and 22. Menopause is complete when you have missed 12 consecutive menstrual periods. It is important to talk with your health care provider about some of the most common conditions that affect postmenopausal women, such as heart disease, cancer, and bone loss (osteoporosis). Adopting a healthy lifestyle and getting preventive care can help to promote your health and wellness. Those actions can also lower your chances of developing some of these common conditions. What should I know about menopause? During menopause, you may experience a number of symptoms, such as:  Moderate-to-severe hot flashes.  Night sweats.  Decrease in sex drive.  Mood swings.  Headaches.  Tiredness.  Irritability.  Memory problems.  Insomnia.  Choosing to treat or not to treat menopausal changes is an individual decision that you make with your health care provider. What should I know about hormone replacement therapy and supplements? Hormone therapy products are effective for treating symptoms that are associated with menopause, such as hot flashes and night sweats. Hormone replacement carries certain risks, especially as you become older. If you are thinking about using estrogen or estrogen with progestin treatments, discuss the benefits and risks with your health care provider. What should I know about heart disease and stroke? Heart disease, heart attack, and stroke become more likely as you age. This may be due, in part, to the hormonal changes that your body experiences during menopause. These can affect how your body processes dietary fats, triglycerides, and  cholesterol. Heart attack and stroke are both medical emergencies. There are many things that you can do to help prevent heart disease and stroke:  Have your blood pressure checked at least every 1-2 years. High blood pressure causes heart disease and increases the risk of stroke.  If you are 56-22 years old, ask your health care provider if you should take aspirin to prevent a heart attack or a stroke.  Do not use any tobacco products, including cigarettes, chewing tobacco, or electronic cigarettes. If you need help quitting, ask your health care provider.  It is important to eat a healthy diet and maintain a healthy weight. ? Be sure to include plenty of vegetables, fruits, low-fat dairy products, and lean protein. ? Avoid eating foods that are high in solid fats, added sugars, or salt (sodium).  Get regular exercise. This is one of the most important things that you can do for your health. ? Try to exercise for at least 150 minutes each week. The type of exercise that you do should increase your heart rate and make you sweat. This is known as moderate-intensity exercise. ? Try to do strengthening exercises at least twice each week. Do these in addition to the moderate-intensity exercise.  Know your numbers.Ask your health care provider to check your cholesterol and your blood glucose. Continue to have your blood tested as directed by your health care provider.  What should I know about cancer screening? There are several types of cancer. Take the following steps to reduce your risk and to catch any cancer development as early as possible. Breast Cancer  Practice breast self-awareness. ? This means understanding how your breasts normally appear  and feel. ? It also means doing regular breast self-exams. Let your health care provider know about any changes, no matter how small.  If you are 24 or older, have a clinician do a breast exam (clinical breast exam or CBE) every year. Depending  on your age, family history, and medical history, it may be recommended that you also have a yearly breast X-ray (mammogram).  If you have a family history of breast cancer, talk with your health care provider about genetic screening.  If you are at high risk for breast cancer, talk with your health care provider about having an MRI and a mammogram every year.  Breast cancer (BRCA) gene test is recommended for women who have family members with BRCA-related cancers. Results of the assessment will determine the need for genetic counseling and BRCA1 and for BRCA2 testing. BRCA-related cancers include these types: ? Breast. This occurs in males or females. ? Ovarian. ? Tubal. This may also be called fallopian tube cancer. ? Cancer of the abdominal or pelvic lining (peritoneal cancer). ? Prostate. ? Pancreatic.  Cervical, Uterine, and Ovarian Cancer Your health care provider may recommend that you be screened regularly for cancer of the pelvic organs. These include your ovaries, uterus, and vagina. This screening involves a pelvic exam, which includes checking for microscopic changes to the surface of your cervix (Pap test).  For women ages 21-65, health care providers may recommend a pelvic exam and a Pap test every three years. For women ages 18-65, they may recommend the Pap test and pelvic exam, combined with testing for human papilloma virus (HPV), every five years. Some types of HPV increase your risk of cervical cancer. Testing for HPV may also be done on women of any age who have unclear Pap test results.  Other health care providers may not recommend any screening for nonpregnant women who are considered low risk for pelvic cancer and have no symptoms. Ask your health care provider if a screening pelvic exam is right for you.  If you have had past treatment for cervical cancer or a condition that could lead to cancer, you need Pap tests and screening for cancer for at least 20 years after  your treatment. If Pap tests have been discontinued for you, your risk factors (such as having a new sexual partner) need to be reassessed to determine if you should start having screenings again. Some women have medical problems that increase the chance of getting cervical cancer. In these cases, your health care provider may recommend that you have screening and Pap tests more often.  If you have a family history of uterine cancer or ovarian cancer, talk with your health care provider about genetic screening.  If you have vaginal bleeding after reaching menopause, tell your health care provider.  There are currently no reliable tests available to screen for ovarian cancer.  Lung Cancer Lung cancer screening is recommended for adults 55-63 years old who are at high risk for lung cancer because of a history of smoking. A yearly low-dose CT scan of the lungs is recommended if you:  Currently smoke.  Have a history of at least 30 pack-years of smoking and you currently smoke or have quit within the past 15 years. A pack-year is smoking an average of one pack of cigarettes per day for one year.  Yearly screening should:  Continue until it has been 15 years since you quit.  Stop if you develop a health problem that would prevent you from  having lung cancer treatment.  Colorectal Cancer  This type of cancer can be detected and can often be prevented.  Routine colorectal cancer screening usually begins at age 23 and continues through age 55.  If you have risk factors for colon cancer, your health care provider may recommend that you be screened at an earlier age.  If you have a family history of colorectal cancer, talk with your health care provider about genetic screening.  Your health care provider may also recommend using home test kits to check for hidden blood in your stool.  A small camera at the end of a tube can be used to examine your colon directly (sigmoidoscopy or colonoscopy).  This is done to check for the earliest forms of colorectal cancer.  Direct examination of the colon should be repeated every 5-10 years until age 63. However, if early forms of precancerous polyps or small growths are found or if you have a family history or genetic risk for colorectal cancer, you may need to be screened more often.  Skin Cancer  Check your skin from head to toe regularly.  Monitor any moles. Be sure to tell your health care provider: ? About any new moles or changes in moles, especially if there is a change in a mole's shape or color. ? If you have a mole that is larger than the size of a pencil eraser.  If any of your family members has a history of skin cancer, especially at a young age, talk with your health care provider about genetic screening.  Always use sunscreen. Apply sunscreen liberally and repeatedly throughout the day.  Whenever you are outside, protect yourself by wearing long sleeves, pants, a wide-brimmed hat, and sunglasses.  What should I know about osteoporosis? Osteoporosis is a condition in which bone destruction happens more quickly than new bone creation. After menopause, you may be at an increased risk for osteoporosis. To help prevent osteoporosis or the bone fractures that can happen because of osteoporosis, the following is recommended:  If you are 2-27 years old, get at least 1,000 mg of calcium and at least 600 mg of vitamin D per day.  If you are older than age 2 but younger than age 64, get at least 1,200 mg of calcium and at least 600 mg of vitamin D per day.  If you are older than age 62, get at least 1,200 mg of calcium and at least 800 mg of vitamin D per day.  Smoking and excessive alcohol intake increase the risk of osteoporosis. Eat foods that are rich in calcium and vitamin D, and do weight-bearing exercises several times each week as directed by your health care provider. What should I know about how menopause affects my mental  health? Depression may occur at any age, but it is more common as you become older. Common symptoms of depression include:  Low or sad mood.  Changes in sleep patterns.  Changes in appetite or eating patterns.  Feeling an overall lack of motivation or enjoyment of activities that you previously enjoyed.  Frequent crying spells.  Talk with your health care provider if you think that you are experiencing depression. What should I know about immunizations? It is important that you get and maintain your immunizations. These include:  Tetanus, diphtheria, and pertussis (Tdap) booster vaccine.  Influenza every year before the flu season begins.  Pneumonia vaccine.  Shingles vaccine.  Your health care provider may also recommend other immunizations. This information is not  intended to replace advice given to you by your health care provider. Make sure you discuss any questions you have with your health care provider. Document Released: 12/22/2005 Document Revised: 05/19/2016 Document Reviewed: 08/03/2015 Elsevier Interactive Patient Education  2018 [employer].

## 2017-05-03 NOTE — Assessment & Plan Note (Signed)
Continues estradiol per GYN.  Continued to encourage decreased dose - she states she is down to QOD dosing.

## 2017-05-04 LAB — LIPID PANEL
Cholesterol: 196 mg/dL (ref 0–200)
HDL: 81.6 mg/dL (ref >39.00)
LDL Cholesterol: 89 mg/dL (ref 0–99)
NonHDL: 114.46
Total CHOL/HDL Ratio: 2
Triglycerides: 126 mg/dL (ref 0.0–149.0)
VLDL: 25.2 mg/dL (ref 0.0–40.0)

## 2017-05-04 LAB — BASIC METABOLIC PANEL
BUN: 20 mg/dL (ref 6–23)
CO2: 28 mEq/L (ref 19–32)
Calcium: 9.2 mg/dL (ref 8.4–10.5)
Chloride: 103 mEq/L (ref 96–112)
Creatinine, Ser: 0.6 mg/dL (ref 0.40–1.20)
GFR: 108.61 mL/min (ref >60.00)
Glucose, Bld: 91 mg/dL (ref 70–99)
Potassium: 4.3 mEq/L (ref 3.5–5.1)
Sodium: 139 mEq/L (ref 135–145)

## 2017-05-04 LAB — TSH: TSH: 1.59 u[IU]/mL (ref 0.35–4.50)

## 2017-05-07 ENCOUNTER — Encounter: Payer: Self-pay | Admitting: *Deleted

## 2017-05-23 ENCOUNTER — Other Ambulatory Visit (INDEPENDENT_AMBULATORY_CARE_PROVIDER_SITE_OTHER): Payer: BLUE CROSS/BLUE SHIELD

## 2017-05-23 DIAGNOSIS — Z1211 Encounter for screening for malignant neoplasm of colon: Secondary | ICD-10-CM

## 2017-05-23 LAB — FECAL OCCULT BLOOD, IMMUNOCHEMICAL: Fecal Occult Bld: NEGATIVE

## 2017-06-07 DIAGNOSIS — Z13220 Encounter for screening for lipoid disorders: Secondary | ICD-10-CM | POA: Diagnosis not present

## 2017-06-07 DIAGNOSIS — R635 Abnormal weight gain: Secondary | ICD-10-CM | POA: Diagnosis not present

## 2017-06-07 DIAGNOSIS — Z131 Encounter for screening for diabetes mellitus: Secondary | ICD-10-CM | POA: Diagnosis not present

## 2017-07-02 DIAGNOSIS — M5136 Other intervertebral disc degeneration, lumbar region: Secondary | ICD-10-CM | POA: Diagnosis not present

## 2017-07-02 DIAGNOSIS — M5030 Other cervical disc degeneration, unspecified cervical region: Secondary | ICD-10-CM | POA: Diagnosis not present

## 2017-07-05 ENCOUNTER — Encounter: Payer: Self-pay | Admitting: Family Medicine

## 2017-07-05 DIAGNOSIS — M5030 Other cervical disc degeneration, unspecified cervical region: Secondary | ICD-10-CM | POA: Insufficient documentation

## 2017-07-05 DIAGNOSIS — M5136 Other intervertebral disc degeneration, lumbar region: Secondary | ICD-10-CM | POA: Insufficient documentation

## 2017-07-10 DIAGNOSIS — R635 Abnormal weight gain: Secondary | ICD-10-CM | POA: Diagnosis not present

## 2017-08-01 DIAGNOSIS — M5030 Other cervical disc degeneration, unspecified cervical region: Secondary | ICD-10-CM | POA: Diagnosis not present

## 2017-08-08 DIAGNOSIS — M545 Low back pain: Secondary | ICD-10-CM | POA: Diagnosis not present

## 2017-08-09 DIAGNOSIS — R635 Abnormal weight gain: Secondary | ICD-10-CM | POA: Diagnosis not present

## 2017-08-16 DIAGNOSIS — G43019 Migraine without aura, intractable, without status migrainosus: Secondary | ICD-10-CM | POA: Diagnosis not present

## 2017-08-16 DIAGNOSIS — G43719 Chronic migraine without aura, intractable, without status migrainosus: Secondary | ICD-10-CM | POA: Diagnosis not present

## 2017-08-20 DIAGNOSIS — M47816 Spondylosis without myelopathy or radiculopathy, lumbar region: Secondary | ICD-10-CM | POA: Diagnosis not present

## 2017-08-28 DIAGNOSIS — M5136 Other intervertebral disc degeneration, lumbar region: Secondary | ICD-10-CM | POA: Diagnosis not present

## 2017-08-28 DIAGNOSIS — M47816 Spondylosis without myelopathy or radiculopathy, lumbar region: Secondary | ICD-10-CM | POA: Diagnosis not present

## 2017-08-28 DIAGNOSIS — M545 Low back pain: Secondary | ICD-10-CM | POA: Diagnosis not present

## 2017-08-28 DIAGNOSIS — M48061 Spinal stenosis, lumbar region without neurogenic claudication: Secondary | ICD-10-CM | POA: Diagnosis not present

## 2017-09-07 DIAGNOSIS — M47816 Spondylosis without myelopathy or radiculopathy, lumbar region: Secondary | ICD-10-CM | POA: Diagnosis not present

## 2017-09-07 DIAGNOSIS — M545 Low back pain: Secondary | ICD-10-CM | POA: Diagnosis not present

## 2017-09-07 DIAGNOSIS — M48061 Spinal stenosis, lumbar region without neurogenic claudication: Secondary | ICD-10-CM | POA: Diagnosis not present

## 2017-09-24 DIAGNOSIS — M47816 Spondylosis without myelopathy or radiculopathy, lumbar region: Secondary | ICD-10-CM | POA: Diagnosis not present

## 2017-10-16 DIAGNOSIS — Z01419 Encounter for gynecological examination (general) (routine) without abnormal findings: Secondary | ICD-10-CM | POA: Diagnosis not present

## 2017-10-16 DIAGNOSIS — R635 Abnormal weight gain: Secondary | ICD-10-CM | POA: Diagnosis not present

## 2017-11-28 ENCOUNTER — Other Ambulatory Visit: Payer: Self-pay | Admitting: Family Medicine

## 2017-12-06 DIAGNOSIS — R635 Abnormal weight gain: Secondary | ICD-10-CM | POA: Diagnosis not present

## 2018-02-07 DIAGNOSIS — R635 Abnormal weight gain: Secondary | ICD-10-CM | POA: Diagnosis not present

## 2018-02-12 DIAGNOSIS — G43019 Migraine without aura, intractable, without status migrainosus: Secondary | ICD-10-CM | POA: Diagnosis not present

## 2018-02-12 DIAGNOSIS — G43719 Chronic migraine without aura, intractable, without status migrainosus: Secondary | ICD-10-CM | POA: Diagnosis not present

## 2018-04-10 DIAGNOSIS — R635 Abnormal weight gain: Secondary | ICD-10-CM | POA: Diagnosis not present

## 2018-05-08 DIAGNOSIS — R635 Abnormal weight gain: Secondary | ICD-10-CM | POA: Diagnosis not present

## 2018-06-05 DIAGNOSIS — R635 Abnormal weight gain: Secondary | ICD-10-CM | POA: Diagnosis not present

## 2018-06-10 ENCOUNTER — Other Ambulatory Visit: Payer: Self-pay

## 2018-06-10 ENCOUNTER — Ambulatory Visit
Admission: EM | Admit: 2018-06-10 | Discharge: 2018-06-10 | Disposition: A | Discharge status: Home / Self Care | Payer: BLUE CROSS/BLUE SHIELD | Attending: Family Medicine | Admitting: Family Medicine

## 2018-06-10 DIAGNOSIS — H9192 Unspecified hearing loss, left ear: Secondary | ICD-10-CM | POA: Diagnosis not present

## 2018-06-10 DIAGNOSIS — H6122 Impacted cerumen, left ear: Secondary | ICD-10-CM | POA: Diagnosis not present

## 2018-06-10 MED ORDER — NEOMYCIN-POLYMYXIN-HC 3.5-10000-1 OT SUSP: 4.0000 [drp] | Freq: Three times a day (TID) | OTIC | 0 refills | Status: DC

## 2018-06-10 NOTE — Discharge Instructions (Addendum)
Use mineral oil on a cottonball place in the ear for 5 to 10 minutes once a week to help decrease earwax buildup

## 2018-06-10 NOTE — ED Triage Notes (Signed)
Patient complains of left ear fullness that started last week. Patient states that daughter in law is an audiologist and states that she has ear wax buildup. Patient states that she has trouble hearing out of it. Patient states that she has been using debrox since Saturday.

## 2018-06-10 NOTE — ED Provider Notes (Addendum)
[facility]    CSN: [id number] Arrival date & time: 06/10/18  8366     History   Chief Complaint Chief Complaint  Patient presents with  . Ear Fullness    HPI Kelly Francis is a 62 y.o. female.   HPI  62 year old female presents with left ear fullness that started last week.  She states that she has a decrease in her hearing ability.  Has had no drainage.  She has been using Debrox ear wax kit but without much success.  Her daughter is audiologist and states that she does have significant earwax buildup in the left ear.  No fever or chills.       Past Medical History:  Diagnosis Date  . Chest pain    a. 2006 Neg MV;  b. 08/2014 ETT: Ex time 9:00, Max HR 166 (101%), BP 210/115, no c/p or ecg changes.  . Dyslipidemia    mild, high HDL  . Glaucoma    very mild  . History of kidney stones   . History of pneumonia 1990  . Kidney stones   . Leg pain   . Migraine    frequent, [facility]  . 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 Vitamin B12 deficiency 02/2011   s/p 1 year injections, now oral replacement    Patient Active Problem List   Diagnosis Date Noted  . DDD (degenerative disc disease), cervical 07/05/2017  . DDD (degenerative disc disease), lumbar 07/05/2017  . Postmenopausal hormone replacement therapy 12/01/2016  . Hot flash, menopausal 12/01/2016  . Obesity, Class I, BMI 30-34.9 08/26/2014  . Precordial chest pain 08/25/2014  . Pain of both heels 03/12/2014  . Dyslipidemia   . Healthcare maintenance 03/01/2011  . Environmental allergies 03/01/2011  . Fatigue 02/02/2011  . Glaucoma   . Kidney stones   . Common migraine     Past Surgical History:  Procedure Laterality Date  . CHOLECYSTECTOMY  1994  . EXPLORATORY LAPAROTOMY  1998   Removed ovarian cysts and fibroids  . VAGINAL HYSTERECTOMY  1999   for fibroids/endometriosis, ovaries remain  . VEIN LIGATION AND STRIPPING  2012   Dr. Hulda Humphrey    OB History   None       Home Medications    Prior to Admission medications   Medication Sig Start Date End Date Taking? Authorizing Provider  [facility] VITAMIN B-12 500 MCG tablet TAKE 1 TABLET BY MOUTH EVERY DAY 09/20/15  Yes [name], MD  EPINEPHrine (EPIPEN) 0.3 mg/0.3 mL IJ SOAJ injection Inject 0.3 mLs (0.3 mg total) into the muscle once. Patient taking differently: Inject 0.3 mg into the muscle daily as needed (allergic reaction).  06/25/14  Yes [name], MD  estradiol (ESTRACE) 1 MG tablet Take 1 mg by mouth daily.     Yes [provider]  nitroGLYCERIN (NITROSTAT) 0.4 MG SL tablet Place 1 tablet (0.4 mg total) under the tongue every 5 (five) minutes as needed for chest pain. 08/28/14  Yes Minna Merritts, MD  omeprazole (PRILOSEC) 40 MG capsule TAKE 1 CAPSULE EVERY DAY 11/28/17  Yes [name], MD  topiramate (TOPAMAX) 25 MG tablet Take 25 mg by mouth at bedtime.     Yes [provider]  neomycin-polymyxin-hydrocortisone (CORTISPORIN) 3.5-10000-1 OTIC suspension Place 4 drops into both ears 3 (three) times daily. 06/10/18   Lorin Picket, PA-C    Family History Family History  Problem Relation Age of Onset  . Prostate cancer Paternal Grandfather   .  Heart attack Paternal Grandfather 83       deceased, also strong family hx CAD/MI  . Diabetes Mother   . Thyroid disease Mother   . Lung cancer Paternal Uncle   . Heart disease Other        strong family hx (cousin dec 49, PGF dec 57)  . Hyperlipidemia Unknown   . Atrial fibrillation Brother        with MVP and PVCs  . Heart attack Cousin 30       sudden death  . Heart attack Paternal Uncle   . Melanoma Sister   . Crohn's disease Sister   . Thyroid disease Sister     Social History Social History   Tobacco Use  . Smoking status: Never Smoker  . Smokeless tobacco: Never Used  Substance Use Topics  . Alcohol use: Yes    Comment: Socially  . Drug use: No     Allergies   Yellow jacket venom [bee  venom]   Review of Systems Review of Systems  Constitutional: Negative for activity change, appetite change, chills, diaphoresis, fatigue and fever.  HENT: Positive for ear pain. Negative for ear discharge.   All other systems reviewed and are negative.    Physical Exam Triage Vital Signs ED Triage Vitals  Enc Vitals Group     BP 06/10/18 0952 (!) 123/94     Pulse Rate 06/10/18 0952 80     Resp 06/10/18 0952 18     Temp 06/10/18 0952 98.1 F (36.7 C)     Temp Source 06/10/18 0952 Oral     SpO2 06/10/18 0952 100 %     Weight 06/10/18 0950 184 lb (83.5 kg)     Height 06/10/18 0950 5' 3"  (1.6 m)     Head Circumference --      Peak Flow --      Pain Score 06/10/18 0950 0     Pain Loc --      Pain Edu? --      Excl. in [place]? --    No data found.  Updated Vital Signs BP (!) 123/94 (BP Location: Left Arm)   Pulse 80   Temp 98.1 F (36.7 C) (Oral)   Resp 18   Ht 5' 3"  (1.6 m)   Wt 184 lb (83.5 kg)   SpO2 100%   BMI 32.59 kg/m   Visual Acuity Right Eye Distance:   Left Eye Distance:   Bilateral Distance:    Right Eye Near:   Left Eye Near:    Bilateral Near:     Physical Exam  Constitutional: She is oriented to person, place, and time. She appears well-developed and well-nourished. No distress.  HENT:  Head: Normocephalic.  Right Ear: External ear normal.  Nose: Nose normal.  Mouth/Throat: Oropharynx is clear and moist. No oropharyngeal exudate.  Left ear canal is occluded with cerumen.   Following removal of the cerumen she had a clear canal.  It was erythematous and irritated.  TM was normal.  Eyes: Pupils are equal, round, and reactive to light. Right eye exhibits no discharge. Left eye exhibits no discharge.  Neck: Normal range of motion.  Musculoskeletal: Normal range of motion.  Neurological: She is alert and oriented to person, place, and time.  Skin: Skin is warm and dry. She is not diaphoretic.  Psychiatric: She has a normal mood and affect. Her  behavior is normal. Judgment and thought content normal.     UC Treatments / Results  Labs (all labs ordered are listed, but only abnormal results are displayed) Labs Reviewed - No data to display  EKG None  Radiology No results found.  Procedures Procedures (including critical care time)  Medications Ordered in UC Medications - No data to display  Initial Impression / Assessment and Plan / UC Course  I have reviewed the triage vital signs and the nursing notes.  Pertinent labs & imaging results that were available during my care of the patient were reviewed by me and considered in my medical decision making (see chart for details).     Plan: 1. Test/x-ray results and diagnosis reviewed with patient 2. rx as per orders; risks, benefits, potential side effects reviewed with patient 3. Recommend supportive treatment with use of her Cortisporin Otic for the next 5 days because of irritation in the canal.  If she has continued problems she should see an ear nose and throat physician.  Recommended prophylactically to use mineral hello saturated on a cottonball placed in the ear for 5 to 10 minutes weekly to help decrease the buildup of earwax. 4. F/u prn if symptoms worsen or don't improve  Final Clinical Impressions(s) / UC Diagnoses   Final diagnoses:  Hearing loss secondary to cerumen impaction, left     Discharge Instructions     Use mineral oil on a cottonball place in the ear for 5 to 10 minutes once a week to help decrease earwax buildup    ED Prescriptions    Medication Sig Dispense Auth. Provider   neomycin-polymyxin-hydrocortisone (CORTISPORIN) 3.5-10000-1 OTIC suspension Place 4 drops into both ears 3 (three) times daily. 10 mL Lorin Picket, PA-C     Controlled Substance Prescriptions [place] Controlled Substance Registry consulted? Not Applicable   Lorin Picket, PA-C 06/10/18 1525    Lorin Picket, PA-C 06/27/18 [phone]

## 2018-07-02 DIAGNOSIS — R635 Abnormal weight gain: Secondary | ICD-10-CM | POA: Diagnosis not present

## 2018-08-20 DIAGNOSIS — G43019 Migraine without aura, intractable, without status migrainosus: Secondary | ICD-10-CM | POA: Diagnosis not present

## 2018-08-20 DIAGNOSIS — G43719 Chronic migraine without aura, intractable, without status migrainosus: Secondary | ICD-10-CM | POA: Diagnosis not present

## 2018-09-05 DIAGNOSIS — R42 Dizziness and giddiness: Secondary | ICD-10-CM | POA: Diagnosis not present

## 2018-09-05 DIAGNOSIS — R7309 Other abnormal glucose: Secondary | ICD-10-CM | POA: Diagnosis not present

## 2018-09-05 DIAGNOSIS — E039 Hypothyroidism, unspecified: Secondary | ICD-10-CM | POA: Diagnosis not present

## 2018-09-05 DIAGNOSIS — R946 Abnormal results of thyroid function studies: Secondary | ICD-10-CM | POA: Diagnosis not present

## 2018-09-19 DIAGNOSIS — R42 Dizziness and giddiness: Secondary | ICD-10-CM | POA: Diagnosis not present

## 2018-10-23 DIAGNOSIS — R635 Abnormal weight gain: Secondary | ICD-10-CM | POA: Diagnosis not present

## 2018-12-26 DIAGNOSIS — R635 Abnormal weight gain: Secondary | ICD-10-CM | POA: Diagnosis not present

## 2019-04-22 DIAGNOSIS — G43019 Migraine without aura, intractable, without status migrainosus: Secondary | ICD-10-CM | POA: Diagnosis not present

## 2019-04-22 DIAGNOSIS — G43719 Chronic migraine without aura, intractable, without status migrainosus: Secondary | ICD-10-CM | POA: Diagnosis not present

## 2019-05-29 DIAGNOSIS — R635 Abnormal weight gain: Secondary | ICD-10-CM | POA: Diagnosis not present

## 2019-09-03 DIAGNOSIS — Z1231 Encounter for screening mammogram for malignant neoplasm of breast: Secondary | ICD-10-CM | POA: Diagnosis not present

## 2019-09-24 ENCOUNTER — Other Ambulatory Visit: Payer: Self-pay | Admitting: Obstetrics and Gynecology

## 2019-09-24 DIAGNOSIS — R928 Other abnormal and inconclusive findings on diagnostic imaging of breast: Secondary | ICD-10-CM

## 2019-10-03 ENCOUNTER — Other Ambulatory Visit: Payer: Self-pay | Admitting: Obstetrics and Gynecology

## 2019-10-03 ENCOUNTER — Other Ambulatory Visit: Payer: Self-pay

## 2019-10-03 ENCOUNTER — Ambulatory Visit
Admission: RE | Admit: 2019-10-03 | Discharge: 2019-10-03 | Disposition: A | Discharge status: Home / Self Care | Payer: BLUE CROSS/BLUE SHIELD | Source: Ambulatory Visit | Attending: Obstetrics and Gynecology | Admitting: Obstetrics and Gynecology

## 2019-10-03 ENCOUNTER — Ambulatory Visit
Admission: RE | Admit: 2019-10-03 | Discharge: 2019-10-03 | Disposition: A | Discharge status: Home / Self Care | Payer: BC Managed Care – PPO | Source: Ambulatory Visit | Attending: Obstetrics and Gynecology | Admitting: Obstetrics and Gynecology

## 2019-10-03 DIAGNOSIS — R928 Other abnormal and inconclusive findings on diagnostic imaging of breast: Secondary | ICD-10-CM | POA: Diagnosis not present

## 2019-10-03 DIAGNOSIS — N6310 Unspecified lump in the right breast, unspecified quadrant: Secondary | ICD-10-CM

## 2019-10-03 DIAGNOSIS — N6313 Unspecified lump in the right breast, lower outer quadrant: Secondary | ICD-10-CM | POA: Diagnosis not present

## 2019-10-03 DIAGNOSIS — N6311 Unspecified lump in the right breast, upper outer quadrant: Secondary | ICD-10-CM | POA: Diagnosis not present

## 2019-10-08 ENCOUNTER — Ambulatory Visit
Admission: RE | Admit: 2019-10-08 | Discharge: 2019-10-08 | Disposition: A | Discharge status: Home / Self Care | Payer: BLUE CROSS/BLUE SHIELD | Source: Ambulatory Visit | Attending: Obstetrics and Gynecology | Admitting: Obstetrics and Gynecology

## 2019-10-08 ENCOUNTER — Other Ambulatory Visit: Payer: Self-pay

## 2019-10-08 ENCOUNTER — Other Ambulatory Visit: Payer: Self-pay | Admitting: Body Imaging

## 2019-10-08 ENCOUNTER — Ambulatory Visit
Admission: RE | Admit: 2019-10-08 | Discharge: 2019-10-08 | Disposition: A | Discharge status: Home / Self Care | Payer: BC Managed Care – PPO | Source: Ambulatory Visit | Attending: Obstetrics and Gynecology | Admitting: Obstetrics and Gynecology

## 2019-10-08 DIAGNOSIS — N6310 Unspecified lump in the right breast, unspecified quadrant: Secondary | ICD-10-CM

## 2019-10-08 DIAGNOSIS — N6311 Unspecified lump in the right breast, upper outer quadrant: Secondary | ICD-10-CM | POA: Diagnosis not present

## 2019-10-08 DIAGNOSIS — N6313 Unspecified lump in the right breast, lower outer quadrant: Secondary | ICD-10-CM | POA: Diagnosis not present

## 2019-10-08 DIAGNOSIS — D241 Benign neoplasm of right breast: Secondary | ICD-10-CM | POA: Diagnosis not present

## 2019-10-08 HISTORY — PX: BREAST BIOPSY: SHX20

## 2019-10-13 ENCOUNTER — Other Ambulatory Visit: Payer: Self-pay | Admitting: Obstetrics and Gynecology

## 2019-10-13 ENCOUNTER — Other Ambulatory Visit: Payer: Self-pay

## 2019-10-13 DIAGNOSIS — N63 Unspecified lump in unspecified breast: Secondary | ICD-10-CM

## 2019-10-15 ENCOUNTER — Other Ambulatory Visit: Payer: Self-pay

## 2019-10-15 ENCOUNTER — Other Ambulatory Visit: Payer: Self-pay | Admitting: Obstetrics and Gynecology

## 2019-10-15 ENCOUNTER — Ambulatory Visit
Admission: RE | Admit: 2019-10-15 | Discharge: 2019-10-15 | Disposition: A | Discharge status: Home / Self Care | Payer: BC Managed Care – PPO | Source: Ambulatory Visit | Attending: Obstetrics and Gynecology | Admitting: Obstetrics and Gynecology

## 2019-10-15 ENCOUNTER — Ambulatory Visit
Admission: RE | Admit: 2019-10-15 | Discharge: 2019-10-15 | Disposition: A | Discharge status: Home / Self Care | Payer: BC Managed Care – PPO | Source: Ambulatory Visit

## 2019-10-15 DIAGNOSIS — N6011 Diffuse cystic mastopathy of right breast: Secondary | ICD-10-CM | POA: Diagnosis not present

## 2019-10-15 DIAGNOSIS — N63 Unspecified lump in unspecified breast: Secondary | ICD-10-CM

## 2019-10-15 DIAGNOSIS — N6311 Unspecified lump in the right breast, upper outer quadrant: Secondary | ICD-10-CM | POA: Diagnosis not present

## 2019-10-15 DIAGNOSIS — N6313 Unspecified lump in the right breast, lower outer quadrant: Secondary | ICD-10-CM | POA: Diagnosis not present

## 2019-10-15 DIAGNOSIS — N6315 Unspecified lump in the right breast, overlapping quadrants: Secondary | ICD-10-CM | POA: Diagnosis not present

## 2019-10-15 HISTORY — PX: BREAST BIOPSY: SHX20

## 2019-10-16 DIAGNOSIS — G43019 Migraine without aura, intractable, without status migrainosus: Secondary | ICD-10-CM | POA: Diagnosis not present

## 2019-10-16 DIAGNOSIS — G43719 Chronic migraine without aura, intractable, without status migrainosus: Secondary | ICD-10-CM | POA: Diagnosis not present

## 2019-10-24 ENCOUNTER — Other Ambulatory Visit: Payer: Self-pay | Admitting: Obstetrics and Gynecology

## 2019-10-24 ENCOUNTER — Other Ambulatory Visit: Payer: Self-pay | Admitting: Surgery

## 2019-10-24 ENCOUNTER — Ambulatory Visit
Admission: RE | Admit: 2019-10-24 | Discharge: 2019-10-24 | Disposition: A | Discharge status: Home / Self Care | Payer: BC Managed Care – PPO | Source: Ambulatory Visit | Attending: Obstetrics and Gynecology | Admitting: Obstetrics and Gynecology

## 2019-10-24 ENCOUNTER — Other Ambulatory Visit: Payer: Self-pay

## 2019-10-24 ENCOUNTER — Ambulatory Visit: Payer: Self-pay | Admitting: Surgery

## 2019-10-24 DIAGNOSIS — N63 Unspecified lump in unspecified breast: Secondary | ICD-10-CM

## 2019-10-24 DIAGNOSIS — D241 Benign neoplasm of right breast: Secondary | ICD-10-CM | POA: Diagnosis not present

## 2019-10-24 DIAGNOSIS — R2232 Localized swelling, mass and lump, left upper limb: Secondary | ICD-10-CM

## 2019-10-24 DIAGNOSIS — N6489 Other specified disorders of breast: Secondary | ICD-10-CM | POA: Diagnosis not present

## 2019-10-24 NOTE — H&P (View-Only) (Signed)
Kelly Francis Documented: 10/24/2019 9:07 AM Location: [facility] Patient #: [id number] DOB: [birth year] Single / Language: [name] / Race: White Female  History of Present Illness Marcello Moores A. Mylia Pondexter MD; 10/24/2019 9:32 AM) Patient words: Patient presents as a recall from screen for a right breast mass.  EXAM: DIGITAL DIAGNOSTIC RIGHT MAMMOGRAM WITH TOMO  ULTRASOUND RIGHT BREAST  COMPARISON: Previous exam(s).  ACR Breast Density Category a: The breast tissue is almost entirely fatty.  FINDINGS: Mammogram: Additional full field tomosynthesis views were performed for the questioned mass in the right breast. On the additional imaging there is persistence of a 0.9 cm mass in the retroareolar region. Additionally there is an oval 0.4 cm mass in the upper outer quadrant of the right breast.  Ultrasound:  Targeted ultrasound is performed in the right breast at 9 o'clock 1 cm from the nipple demonstrating a dilated duct with intraductal mass measuring 0.7 x 0.4 x 0.2 cm, which corresponds to the mammographic finding. Targeted ultrasound at 9 o'clock 10 cm from the nipple demonstrates an oval circumscribed hypoechoic mass measuring 0.4 x 0.2 x 0.3 cm. This corresponds to the mammographic finding. Targeted ultrasound of the right axilla demonstrates no suspicious appearing lymph nodes.  IMPRESSION: 1. Right breast mass at 9 o'clock 1 cm from the nipple measuring 0.7 cm likely represents an intraductal mass and is indeterminate. Tissue sampling is recommended.  2. Right breast mass at 9 o'clock 10 cm the nipple measuring 0.4 cm is probably benign and likely represents a small complicated cyst.  RECOMMENDATION: 1. Ultrasound-guided core needle biopsy of the right breast mass at 9 o'clock 1 cm from the nipple.  2. Six-month follow-up right breast diagnostic mammogram and ultrasound for the 0.4 cm mass at 9 o'clock 10 cm from the nipple which is probably benign.  However if the biopsied mass returns as atypia or malignancy, recommend biopsy of this second mass.  I have discussed the findings and recommendations with the patient. If applicable, a reminder letter will be sent to the patient regarding the next appointment.  BI-RADS CATEGORY 4: Suspicious.    Diagnosis Breast, right, needle core biopsy, 9 o'clock - DUCTAL PAPILLOMA. - SEE MICROSCOPIC DESCRIPTION. Microscopic Comment Called to [facility] on 10/10/19. (JDP:ah 10/10/19)       Patient: Kelly, Francis Collected: 10/15/2019 Client: [facility] Accession: [id number] Received: 10/15/2019 Lovey Newcomer, MD DOB: 01-19-1956 Age: 89 Gender: F Reported: 10/16/2019 [place] Patient Ph: [phone] MRN #: [id number] [place], [place] [address] Client Acc#: Chart #: [id number] Phone: [phone] Fax: CC: REPORT OF SURGICAL PATHOLOGY FINAL DIAGNOSIS Diagnosis Breast, right, needle core biopsy, 9 o'clock, 10cmfn - FIBROCYSTIC CHANGE AND ADENOSIS. - NO MALIGNANCY IDENTIFIED. Microscopic Comment The case was called to [facility] on 10/16/2019. Vicente Males MD Pathologist, Electronic Signature (Case signed 10/16/2019).  The patient is a 63 year old female.   Past Surgical History Emeline Gins, [place]; 10/24/2019 9:07 AM) Gallbladder Surgery - Laparoscopic Hysterectomy (not due to cancer) - Complete  Diagnostic Studies History Emeline Gins, [place]; 10/24/2019 9:07 AM) Colonoscopy never Pap Smear >5 years ago  Allergies Emeline Gins, [place]; 10/24/2019 9:09 AM) BEE VENOM Allergies Reconciled  Medication History Emeline Gins, CMA; 10/24/2019 9:11 AM) Phentermine HCl (37.5MG  Tablet, Oral) Active. Xyzal Allergy 24HR (5MG  Tablet, Oral) Active. Topiramate (25MG  Tablet, Oral) Active. Estradiol (0.5MG  Tablet, Oral) Active. Omeprazole (20MG  Packet, Oral) Active. Medications Reconciled  Social  History Emeline Gins, [place]; 10/24/2019  9:07 AM) Alcohol use Occasional alcohol use. Caffeine use Tea. No drug use Tobacco use Never smoker.  Family History Emeline Gins, [place]; 10/24/2019 9:07 AM) Breast Cancer Family Members In General. Cancer Family Members In General. Cerebrovascular Accident Mother. Diabetes Mellitus Family Members In General, Mother. Heart Disease Family Members In General. Heart disease in female family member before age 54 Hypertension Mother. Melanoma Sister. Migraine Headache Father, Son. Prostate Cancer Family Members In General. Thyroid problems Mother, Sister.  Pregnancy / Birth History Emeline Gins, [place]; 10/24/2019 9:07 AM) Age at menarche 84 years. Age of menopause <47 Contraceptive History Oral contraceptives. Gravida 2 Maternal age 44-20 Para 1 Regular periods  Other Problems Emeline Gins, CMA; 10/24/2019 9:07 AM) Back Pain Gastroesophageal Reflux Disease Kidney Stone Migraine Headache     Review of Systems Emeline Gins CMA; 10/24/2019 9:07 AM) General Not Present- Appetite Loss, Chills, Fatigue, Fever, Night Sweats, Weight Gain and Weight Loss. Skin Not Present- Change in Wart/Mole, Dryness, Hives, Jaundice, New Lesions, Non-Healing Wounds, Rash and Ulcer. HEENT Present- Seasonal Allergies and Wears glasses/contact lenses. Not Present- Earache, Hearing Loss, Hoarseness, Nose Bleed, Oral Ulcers, Ringing in the Ears, Sinus Pain, Sore Throat, Visual Disturbances and Yellow Eyes. Respiratory Not Present- Bloody sputum, Chronic Cough, Difficulty Breathing, Snoring and Wheezing. Breast Present- Breast Mass. Not Present- Breast Pain, Nipple Discharge and Skin Changes. Cardiovascular Not Present- Chest Pain, Difficulty Breathing Lying Down, Leg Cramps, Palpitations, Rapid Heart Rate, Shortness of Breath and Swelling of Extremities. Gastrointestinal Not Present- Abdominal Pain, Bloating, Bloody Stool, Change  in Bowel Habits, Chronic diarrhea, Constipation, Difficulty Swallowing, Excessive gas, Gets full quickly at meals, Hemorrhoids, Indigestion, Nausea, Rectal Pain and Vomiting. Female Genitourinary Not Present- Frequency, Nocturia, Painful Urination, Pelvic Pain and Urgency. Musculoskeletal Present- Back Pain. Not Present- Joint Pain, Joint Stiffness, Muscle Pain, Muscle Weakness and Swelling of Extremities. Neurological Not Present- Decreased Memory, Fainting, Headaches, Numbness, Seizures, Tingling, Tremor, Trouble walking and Weakness. Psychiatric Not Present- Anxiety, Bipolar, Change in Sleep Pattern, Depression, Fearful and Frequent crying. Endocrine Not Present- Cold Intolerance, Excessive Hunger, Hair Changes, Heat Intolerance, Hot flashes and New Diabetes. Hematology Not Present- Blood Thinners, Easy Bruising, Excessive bleeding, Gland problems, HIV and Persistent Infections.  Vitals Emeline Gins CMA; 10/24/2019 9:09 AM) 10/24/2019 9:08 AM Weight: 197 lb Height: 64in Body Surface Area: 1.94 m Body Mass Index: 33.81 kg/m  Temp.: 97.62F  Pulse: 118 (Regular)  BP: 118/82 (Sitting, Left Arm, Standard)        Physical Exam (Eulamae Greenstein A. Ashleyanne Hemmingway MD; 10/24/2019 1:01 PM)  General Mental Status-Alert. General Appearance-Consistent with stated age. Hydration-Well hydrated. Voice-Normal.  Cardiovascular Cardiovascular examination reveals -normal heart sounds, regular rate and rhythm with no murmurs and normal pedal pulses bilaterally.  Neurologic Neurologic evaluation reveals -alert and oriented x 3 with no impairment of recent or remote memory. Mental Status-Normal.  Lymphatic Head & Neck  General Head & Neck Lymphatics: Bilateral - Description - Normal. Axillary  General Axillary Region: Bilateral - Description - Normal. Tenderness - Non Tender.    Assessment & Plan (Aeon Kessner A. Gemini Bunte MD; 10/24/2019 12:54 PM)  PAPILLOMA OF BREAST  (D24.9)   PAPILLOMA OF RIGHT BREAST (D24.1) Impression: Recommend right breast lumpectomy due to risk of potential malignancy. Nonoperative management discussed as well. Upgrade risk is up to 20% in the literature. Risk of lumpectomy include bleeding, infection, seroma, more surgery, use of seed/wire, wound care, cosmetic deformity and the need for other treatments, death , blood clots, death. Pt agrees to proceed.  Current Plans Pt Education -  CCS Breast Biopsy HCI: discussed with patient and provided information. Pt Education - CCS Free Text Education/Instructions: discussed with patient and provided information.

## 2019-10-24 NOTE — H&P (Signed)
Kelly Francis Documented: 10/24/2019 9:07 AM Location: [facility] Patient #: [id number] DOB: December 10, 1955 Single / Language: [name] / Race: White Female  History of Present Illness Kelly Moores A. Kelly Leclaire MD; 10/24/2019 9:32 AM) Patient words: Patient presents as a recall from screen for a right breast mass.  EXAM: DIGITAL DIAGNOSTIC RIGHT MAMMOGRAM WITH TOMO  ULTRASOUND RIGHT BREAST  COMPARISON: Previous exam(s).  ACR Breast Density Category a: The breast tissue is almost entirely fatty.  FINDINGS: Mammogram: Additional full field tomosynthesis views were performed for the questioned mass in the right breast. On the additional imaging there is persistence of a 0.9 cm mass in the retroareolar region. Additionally there is an oval 0.4 cm mass in the upper outer quadrant of the right breast.  Ultrasound:  Targeted ultrasound is performed in the right breast at 9 o'clock 1 cm from the nipple demonstrating a dilated duct with intraductal mass measuring 0.7 x 0.4 x 0.2 cm, which corresponds to the mammographic finding. Targeted ultrasound at 9 o'clock 10 cm from the nipple demonstrates an oval circumscribed hypoechoic mass measuring 0.4 x 0.2 x 0.3 cm. This corresponds to the mammographic finding. Targeted ultrasound of the right axilla demonstrates no suspicious appearing lymph nodes.  IMPRESSION: 1. Right breast mass at 9 o'clock 1 cm from the nipple measuring 0.7 cm likely represents an intraductal mass and is indeterminate. Tissue sampling is recommended.  2. Right breast mass at 9 o'clock 10 cm the nipple measuring 0.4 cm is probably benign and likely represents a small complicated cyst.  RECOMMENDATION: 1. Ultrasound-guided core needle biopsy of the right breast mass at 9 o'clock 1 cm from the nipple.  2. Six-month follow-up right breast diagnostic mammogram and ultrasound for the 0.4 cm mass at 9 o'clock 10 cm from the nipple which is probably benign.  However if the biopsied mass returns as atypia or malignancy, recommend biopsy of this second mass.  I have discussed the findings and recommendations with the patient. If applicable, a reminder letter will be sent to the patient regarding the next appointment.  BI-RADS CATEGORY 4: Suspicious.    Diagnosis Breast, right, needle core biopsy, 9 o'clock - DUCTAL PAPILLOMA. - SEE MICROSCOPIC DESCRIPTION. Microscopic Comment Called to [facility] on 10/10/19. (JDP:ah 10/10/19)       Patient: Kelly Francis, Kelly Francis Collected: 10/15/2019 Client: [facility] Accession: [id number] Received: 10/15/2019 Lovey Newcomer, MD DOB: 16-Aug-1956 Age: 63 Gender: F Reported: 10/16/2019 [place] Patient Ph: [phone] MRN #: [id number] [place], [place] [id number] Client Acc#: Chart #: [id number] Phone: [phone] Fax: CC: REPORT OF SURGICAL PATHOLOGY FINAL DIAGNOSIS Diagnosis Breast, right, needle core biopsy, 9 o'clock, 10cmfn - FIBROCYSTIC CHANGE AND ADENOSIS. - NO MALIGNANCY IDENTIFIED. Microscopic Comment The case was called to [facility] on 10/16/2019. Vicente Males MD Pathologist, Electronic Signature (Case signed 10/16/2019).  The patient is a 63 year old female.   Past Surgical History Emeline Gins, [place]; 10/24/2019 9:07 AM) Gallbladder Surgery - Laparoscopic Hysterectomy (not due to cancer) - Complete  Diagnostic Studies History Emeline Gins, [place]; 10/24/2019 9:07 AM) Colonoscopy never Pap Smear >5 years ago  Allergies Emeline Gins, [place]; 10/24/2019 9:09 AM) BEE VENOM Allergies Reconciled  Medication History Emeline Gins, CMA; 10/24/2019 9:11 AM) Phentermine HCl (37.5MG  Tablet, Oral) Active. Xyzal Allergy 24HR (5MG  Tablet, Oral) Active. Topiramate (25MG  Tablet, Oral) Active. Estradiol (0.5MG  Tablet, Oral) Active. Omeprazole (20MG  Packet, Oral) Active. Medications Reconciled  Social  History Emeline Gins, [place]; 10/24/2019  9:07 AM) Alcohol use Occasional alcohol use. Caffeine use Tea. No drug use Tobacco use Never smoker.  Family History Emeline Gins, [place]; 10/24/2019 9:07 AM) Breast Cancer Family Members In General. Cancer Family Members In General. Cerebrovascular Accident Mother. Diabetes Mellitus Family Members In General, Mother. Heart Disease Family Members In General. Heart disease in female family member before age 28 Hypertension Mother. Melanoma Sister. Migraine Headache Father, Son. Prostate Cancer Family Members In General. Thyroid problems Mother, Sister.  Pregnancy / Birth History Emeline Gins, [place]; 10/24/2019 9:07 AM) Age at menarche 89 years. Age of menopause <47 Contraceptive History Oral contraceptives. Gravida 2 Maternal age 34-20 Para 1 Regular periods  Other Problems Emeline Gins, CMA; 10/24/2019 9:07 AM) Back Pain Gastroesophageal Reflux Disease Kidney Stone Migraine Headache     Review of Systems Emeline Gins CMA; 10/24/2019 9:07 AM) General Not Present- Appetite Loss, Chills, Fatigue, Fever, Night Sweats, Weight Gain and Weight Loss. Skin Not Present- Change in Wart/Mole, Dryness, Hives, Jaundice, New Lesions, Non-Healing Wounds, Rash and Ulcer. HEENT Present- Seasonal Allergies and Wears glasses/contact lenses. Not Present- Earache, Hearing Loss, Hoarseness, Nose Bleed, Oral Ulcers, Ringing in the Ears, Sinus Pain, Sore Throat, Visual Disturbances and Yellow Eyes. Respiratory Not Present- Bloody sputum, Chronic Cough, Difficulty Breathing, Snoring and Wheezing. Breast Present- Breast Mass. Not Present- Breast Pain, Nipple Discharge and Skin Changes. Cardiovascular Not Present- Chest Pain, Difficulty Breathing Lying Down, Leg Cramps, Palpitations, Rapid Heart Rate, Shortness of Breath and Swelling of Extremities. Gastrointestinal Not Present- Abdominal Pain, Bloating, Bloody Stool, Change  in Bowel Habits, Chronic diarrhea, Constipation, Difficulty Swallowing, Excessive gas, Gets full quickly at meals, Hemorrhoids, Indigestion, Nausea, Rectal Pain and Vomiting. Female Genitourinary Not Present- Frequency, Nocturia, Painful Urination, Pelvic Pain and Urgency. Musculoskeletal Present- Back Pain. Not Present- Joint Pain, Joint Stiffness, Muscle Pain, Muscle Weakness and Swelling of Extremities. Neurological Not Present- Decreased Memory, Fainting, Headaches, Numbness, Seizures, Tingling, Tremor, Trouble walking and Weakness. Psychiatric Not Present- Anxiety, Bipolar, Change in Sleep Pattern, Depression, Fearful and Frequent crying. Endocrine Not Present- Cold Intolerance, Excessive Hunger, Hair Changes, Heat Intolerance, Hot flashes and New Diabetes. Hematology Not Present- Blood Thinners, Easy Bruising, Excessive bleeding, Gland problems, HIV and Persistent Infections.  Vitals Emeline Gins CMA; 10/24/2019 9:09 AM) 10/24/2019 9:08 AM Weight: 197 lb Height: 64in Body Surface Area: 1.94 m Body Mass Index: 33.81 kg/m  Temp.: 97.63F  Pulse: 118 (Regular)  BP: 118/82 (Sitting, Left Arm, Standard)        Physical Exam (Johnrobert Foti A. Ismaeel Arvelo MD; 10/24/2019 1:01 PM)  General Mental Status-Alert. General Appearance-Consistent with stated age. Hydration-Well hydrated. Voice-Normal.  Cardiovascular Cardiovascular examination reveals -normal heart sounds, regular rate and rhythm with no murmurs and normal pedal pulses bilaterally.  Neurologic Neurologic evaluation reveals -alert and oriented x 3 with no impairment of recent or remote memory. Mental Status-Normal.  Lymphatic Head & Neck  General Head & Neck Lymphatics: Bilateral - Description - Normal. Axillary  General Axillary Region: Bilateral - Description - Normal. Tenderness - Non Tender.    Assessment & Plan (Sumedh Shinsato A. Demontae Antunes MD; 10/24/2019 12:54 PM)  PAPILLOMA OF BREAST  (D24.9)   PAPILLOMA OF RIGHT BREAST (D24.1) Impression: Recommend right breast lumpectomy due to risk of potential malignancy. Nonoperative management discussed as well. Upgrade risk is up to 20% in the literature. Risk of lumpectomy include bleeding, infection, seroma, more surgery, use of seed/wire, wound care, cosmetic deformity and the need for other treatments, death , blood clots, death. Pt agrees to proceed.  Current Plans Pt Education -  CCS Breast Biopsy HCI: discussed with patient and provided information. Pt Education - CCS Free Text Education/Instructions: discussed with patient and provided information.

## 2019-10-25 ENCOUNTER — Encounter: Payer: Self-pay | Admitting: Family Medicine

## 2019-10-25 DIAGNOSIS — D241 Benign neoplasm of right breast: Secondary | ICD-10-CM | POA: Insufficient documentation

## 2019-11-03 ENCOUNTER — Other Ambulatory Visit: Payer: Self-pay

## 2019-11-03 ENCOUNTER — Encounter (HOSPITAL_BASED_OUTPATIENT_CLINIC_OR_DEPARTMENT_OTHER): Payer: Self-pay | Admitting: Surgery

## 2019-11-08 ENCOUNTER — Other Ambulatory Visit (HOSPITAL_COMMUNITY)
Admission: RE | Admit: 2019-11-08 | Discharge: 2019-11-08 | Disposition: A | Discharge status: Home / Self Care | Payer: BC Managed Care – PPO | Source: Ambulatory Visit | Attending: Surgery | Admitting: Surgery

## 2019-11-08 DIAGNOSIS — Z01812 Encounter for preprocedural laboratory examination: Secondary | ICD-10-CM | POA: Insufficient documentation

## 2019-11-08 DIAGNOSIS — Z20828 Contact with and (suspected) exposure to other viral communicable diseases: Secondary | ICD-10-CM | POA: Diagnosis not present

## 2019-11-09 LAB — NOVEL CORONAVIRUS, NAA (HOSP ORDER, SEND-OUT TO REF LAB; TAT 18-24 HRS): SARS-CoV-2, NAA: NOT DETECTED

## 2019-11-11 ENCOUNTER — Ambulatory Visit
Admission: RE | Admit: 2019-11-11 | Discharge: 2019-11-11 | Disposition: A | Discharge status: Home / Self Care | Payer: BC Managed Care – PPO | Source: Ambulatory Visit | Attending: Surgery | Admitting: Surgery

## 2019-11-11 ENCOUNTER — Encounter (HOSPITAL_BASED_OUTPATIENT_CLINIC_OR_DEPARTMENT_OTHER)
Admission: RE | Admit: 2019-11-11 | Discharge: 2019-11-11 | Disposition: A | Discharge status: Home / Self Care | Payer: BC Managed Care – PPO | Source: Ambulatory Visit | Attending: Surgery | Admitting: Surgery

## 2019-11-11 ENCOUNTER — Other Ambulatory Visit: Payer: Self-pay

## 2019-11-11 DIAGNOSIS — K219 Gastro-esophageal reflux disease without esophagitis: Secondary | ICD-10-CM | POA: Diagnosis not present

## 2019-11-11 DIAGNOSIS — D241 Benign neoplasm of right breast: Secondary | ICD-10-CM

## 2019-11-11 DIAGNOSIS — Z79890 Hormone replacement therapy (postmenopausal): Secondary | ICD-10-CM | POA: Diagnosis not present

## 2019-11-11 DIAGNOSIS — Z886 Allergy status to analgesic agent status: Secondary | ICD-10-CM | POA: Diagnosis not present

## 2019-11-11 DIAGNOSIS — G43909 Migraine, unspecified, not intractable, without status migrainosus: Secondary | ICD-10-CM | POA: Diagnosis not present

## 2019-11-11 DIAGNOSIS — Z79899 Other long term (current) drug therapy: Secondary | ICD-10-CM | POA: Diagnosis not present

## 2019-11-11 DIAGNOSIS — M1990 Unspecified osteoarthritis, unspecified site: Secondary | ICD-10-CM | POA: Diagnosis not present

## 2019-11-11 LAB — CBC WITH DIFFERENTIAL/PLATELET
Abs Immature Granulocytes: 0.02 10*3/uL (ref 0.00–0.07)
Basophils Absolute: 0 10*3/uL (ref 0.0–0.1)
Basophils Relative: 0 %
Eosinophils Absolute: 0.1 10*3/uL (ref 0.0–0.5)
Eosinophils Relative: 1 %
HCT: 43.4 % (ref 36.0–46.0)
Hemoglobin: 14 g/dL (ref 12.0–15.0)
Immature Granulocytes: 0 %
Lymphocytes Relative: 28 %
Lymphs Abs: 2 10*3/uL (ref 0.7–4.0)
MCH: 29 pg (ref 26.0–34.0)
MCHC: 32.3 g/dL (ref 30.0–36.0)
MCV: 89.9 fL (ref 80.0–100.0)
Monocytes Absolute: 0.6 10*3/uL (ref 0.1–1.0)
Monocytes Relative: 8 %
Neutro Abs: 4.4 10*3/uL (ref 1.7–7.7)
Neutrophils Relative %: 63 %
Platelets: 232 10*3/uL (ref 150–400)
RBC: 4.83 MIL/uL (ref 3.87–5.11)
RDW: 13.9 % (ref 11.5–15.5)
WBC: 7 10*3/uL (ref 4.0–10.5)
nRBC: 0 % (ref 0.0–0.2)

## 2019-11-11 LAB — COMPREHENSIVE METABOLIC PANEL
ALT: 20 U/L (ref 0–44)
AST: 21 U/L (ref 15–41)
Albumin: 3.8 g/dL (ref 3.5–5.0)
Alkaline Phosphatase: 98 U/L (ref 38–126)
Anion gap: 6 (ref 5–15)
BUN: 15 mg/dL (ref 8–23)
CO2: 28 mmol/L (ref 22–32)
Calcium: 9 mg/dL (ref 8.9–10.3)
Chloride: 104 mmol/L (ref 98–111)
Creatinine, Ser: 0.64 mg/dL (ref 0.44–1.00)
GFR calc Af Amer: >60 mL/min (ref >60)
GFR calc non Af Amer: >60 mL/min (ref >60)
Glucose, Bld: 97 mg/dL (ref 70–99)
Potassium: 3.4 mmol/L — ABNORMAL LOW (ref 3.5–5.1)
Sodium: 138 mmol/L (ref 135–145)
Total Bilirubin: 0.4 mg/dL (ref 0.3–1.2)
Total Protein: 6.9 g/dL (ref 6.5–8.1)

## 2019-11-11 NOTE — Progress Notes (Signed)

## 2019-11-12 ENCOUNTER — Ambulatory Visit
Admission: RE | Admit: 2019-11-12 | Discharge: 2019-11-12 | Disposition: A | Discharge status: Home / Self Care | Payer: BC Managed Care – PPO | Source: Ambulatory Visit | Attending: Surgery | Admitting: Surgery

## 2019-11-12 ENCOUNTER — Encounter (HOSPITAL_BASED_OUTPATIENT_CLINIC_OR_DEPARTMENT_OTHER): Payer: Self-pay | Admitting: Surgery

## 2019-11-12 ENCOUNTER — Ambulatory Visit (HOSPITAL_BASED_OUTPATIENT_CLINIC_OR_DEPARTMENT_OTHER): Payer: BC Managed Care – PPO | Admitting: Anesthesiology

## 2019-11-12 ENCOUNTER — Other Ambulatory Visit: Payer: Self-pay

## 2019-11-12 ENCOUNTER — Ambulatory Visit (HOSPITAL_BASED_OUTPATIENT_CLINIC_OR_DEPARTMENT_OTHER)
Admission: RE | Admit: 2019-11-12 | Discharge: 2019-11-12 | Disposition: A | Discharge status: Home / Self Care | Payer: BC Managed Care – PPO | Attending: Surgery | Admitting: Surgery

## 2019-11-12 ENCOUNTER — Encounter (HOSPITAL_BASED_OUTPATIENT_CLINIC_OR_DEPARTMENT_OTHER)
Admission: RE | Disposition: A | Discharge status: Home / Self Care | Payer: Self-pay | Source: Home / Self Care | Attending: Surgery

## 2019-11-12 DIAGNOSIS — G43009 Migraine without aura, not intractable, without status migrainosus: Secondary | ICD-10-CM | POA: Diagnosis not present

## 2019-11-12 DIAGNOSIS — M5136 Other intervertebral disc degeneration, lumbar region: Secondary | ICD-10-CM | POA: Diagnosis not present

## 2019-11-12 DIAGNOSIS — D241 Benign neoplasm of right breast: Secondary | ICD-10-CM

## 2019-11-12 DIAGNOSIS — Z79899 Other long term (current) drug therapy: Secondary | ICD-10-CM | POA: Insufficient documentation

## 2019-11-12 DIAGNOSIS — Z79890 Hormone replacement therapy (postmenopausal): Secondary | ICD-10-CM | POA: Diagnosis not present

## 2019-11-12 DIAGNOSIS — G43909 Migraine, unspecified, not intractable, without status migrainosus: Secondary | ICD-10-CM | POA: Insufficient documentation

## 2019-11-12 DIAGNOSIS — Z886 Allergy status to analgesic agent status: Secondary | ICD-10-CM | POA: Diagnosis not present

## 2019-11-12 DIAGNOSIS — E785 Hyperlipidemia, unspecified: Secondary | ICD-10-CM | POA: Diagnosis not present

## 2019-11-12 DIAGNOSIS — M1990 Unspecified osteoarthritis, unspecified site: Secondary | ICD-10-CM | POA: Diagnosis not present

## 2019-11-12 DIAGNOSIS — K219 Gastro-esophageal reflux disease without esophagitis: Secondary | ICD-10-CM | POA: Insufficient documentation

## 2019-11-12 HISTORY — PX: BREAST LUMPECTOMY WITH RADIOACTIVE SEED LOCALIZATION: SHX6424

## 2019-11-12 HISTORY — DX: Nausea with vomiting, unspecified: Z98.890

## 2019-11-12 HISTORY — PX: BREAST EXCISIONAL BIOPSY: SUR124

## 2019-11-12 HISTORY — DX: Other specified postprocedural states: R11.2

## 2019-11-12 SURGERY — BREAST LUMPECTOMY WITH RADIOACTIVE SEED LOCALIZATION
Anesthesia: General | Site: Breast | Laterality: Right

## 2019-11-12 MED ORDER — HYDROCODONE-ACETAMINOPHEN 5-325 MG PO TABS: 1.0000 | ORAL_TABLET | Freq: Four times a day (QID) | ORAL | 0 refills | Status: DC | PRN

## 2019-11-12 MED ORDER — FENTANYL CITRATE (PF) 100 MCG/2ML IJ SOLN
50.0000 ug | INTRAMUSCULAR | Status: DC | PRN
  Administered 2019-11-12 (×2): 50 ug via INTRAVENOUS

## 2019-11-12 MED ORDER — PROPOFOL 10 MG/ML IV BOLUS
INTRAVENOUS | Status: AC
  Filled 2019-11-12: qty 40

## 2019-11-12 MED ORDER — SILVER NITRATE-POT NITRATE 75-25 % EX MISC
CUTANEOUS | Status: AC
  Filled 2019-11-12: qty 10

## 2019-11-12 MED ORDER — FENTANYL CITRATE (PF) 100 MCG/2ML IJ SOLN
INTRAMUSCULAR | Status: AC
  Filled 2019-11-12: qty 4

## 2019-11-12 MED ORDER — BUPIVACAINE-EPINEPHRINE (PF) 0.25% -1:200000 IJ SOLN
INTRAMUSCULAR | Status: DC | PRN
  Administered 2019-11-12: 25 mL via PERINEURAL

## 2019-11-12 MED ORDER — MIDAZOLAM HCL 2 MG/2ML IJ SOLN
INTRAMUSCULAR | Status: AC
  Filled 2019-11-12: qty 2

## 2019-11-12 MED ORDER — FENTANYL CITRATE (PF) 100 MCG/2ML IJ SOLN: 25.0000 ug | INTRAMUSCULAR | Status: DC | PRN

## 2019-11-12 MED ORDER — CEFAZOLIN SODIUM-DEXTROSE 2-4 GM/100ML-% IV SOLN
INTRAVENOUS | Status: AC
  Filled 2019-11-12: qty 100

## 2019-11-12 MED ORDER — OXYCODONE HCL 5 MG PO TABS: 5.0000 mg | ORAL_TABLET | Freq: Once | ORAL | Status: DC | PRN

## 2019-11-12 MED ORDER — MIDAZOLAM HCL 2 MG/2ML IJ SOLN
1.0000 mg | INTRAMUSCULAR | Status: DC | PRN
  Administered 2019-11-12: 2 mg via INTRAVENOUS

## 2019-11-12 MED ORDER — LACTATED RINGERS IV SOLN: INTRAVENOUS | Status: DC

## 2019-11-12 MED ORDER — CHLORHEXIDINE GLUCONATE CLOTH 2 % EX PADS: 6.0000 | MEDICATED_PAD | Freq: Once | CUTANEOUS | Status: DC

## 2019-11-12 MED ORDER — LIDOCAINE HCL (CARDIAC) PF 100 MG/5ML IV SOSY
PREFILLED_SYRINGE | INTRAVENOUS | Status: DC | PRN
  Administered 2019-11-12: 60 mg via INTRAVENOUS

## 2019-11-12 MED ORDER — PROPOFOL 10 MG/ML IV BOLUS
INTRAVENOUS | Status: DC | PRN
  Administered 2019-11-12: 180 mg via INTRAVENOUS

## 2019-11-12 MED ORDER — OXYCODONE HCL 5 MG/5ML PO SOLN: 5.0000 mg | Freq: Once | ORAL | Status: DC | PRN

## 2019-11-12 MED ORDER — CEFAZOLIN SODIUM-DEXTROSE 2-4 GM/100ML-% IV SOLN
2.0000 g | INTRAVENOUS | Status: AC
  Administered 2019-11-12: 2 g via INTRAVENOUS

## 2019-11-12 MED ORDER — ONDANSETRON HCL 4 MG/2ML IJ SOLN
INTRAMUSCULAR | Status: DC | PRN
  Administered 2019-11-12: 4 mg via INTRAVENOUS

## 2019-11-12 MED ORDER — BUPIVACAINE HCL (PF) 0.25 % IJ SOLN
INTRAMUSCULAR | Status: AC
  Filled 2019-11-12: qty 30

## 2019-11-12 MED ORDER — ONDANSETRON HCL 4 MG/2ML IJ SOLN: 4.0000 mg | Freq: Four times a day (QID) | INTRAMUSCULAR | Status: DC | PRN

## 2019-11-12 MED ORDER — DEXAMETHASONE SODIUM PHOSPHATE 10 MG/ML IJ SOLN
INTRAMUSCULAR | Status: DC | PRN
  Administered 2019-11-12: 8 mg via INTRAVENOUS

## 2019-11-12 SURGICAL SUPPLY — 46 items
APPLIER CLIP 9.375 MED OPEN (MISCELLANEOUS)
BINDER BREAST LRG (GAUZE/BANDAGES/DRESSINGS) IMPLANT
BINDER BREAST MEDIUM (GAUZE/BANDAGES/DRESSINGS) IMPLANT
BINDER BREAST XLRG (GAUZE/BANDAGES/DRESSINGS) IMPLANT
BINDER BREAST XXLRG (GAUZE/BANDAGES/DRESSINGS) ×2 IMPLANT
BLADE SURG 15 STRL LF DISP TIS (BLADE) ×1 IMPLANT
BLADE SURG 15 STRL SS (BLADE) ×1
CANISTER SUC SOCK COL 7IN (MISCELLANEOUS) IMPLANT
CANISTER SUCT 1200ML W/VALVE (MISCELLANEOUS) IMPLANT
CHLORAPREP W/TINT 26 (MISCELLANEOUS) ×2 IMPLANT
CLIP APPLIE 9.375 MED OPEN (MISCELLANEOUS) IMPLANT
COVER BACK TABLE REUSABLE LG (DRAPES) ×2 IMPLANT
COVER MAYO STAND REUSABLE (DRAPES) ×2 IMPLANT
COVER PROBE W GEL 5X96 (DRAPES) ×2 IMPLANT
COVER WAND RF STERILE (DRAPES) IMPLANT
DECANTER SPIKE VIAL GLASS SM (MISCELLANEOUS) IMPLANT
DERMABOND ADVANCED (GAUZE/BANDAGES/DRESSINGS) ×1
DERMABOND ADVANCED .7 DNX12 (GAUZE/BANDAGES/DRESSINGS) ×1 IMPLANT
DRAPE LAPAROSCOPIC ABDOMINAL (DRAPES) IMPLANT
DRAPE LAPAROTOMY 100X72 PEDS (DRAPES) ×2 IMPLANT
DRAPE UTILITY XL STRL (DRAPES) ×2 IMPLANT
ELECT COATED BLADE 2.86 ST (ELECTRODE) ×2 IMPLANT
ELECT REM PT RETURN 9FT ADLT (ELECTROSURGICAL) ×2
ELECTRODE REM PT RTRN 9FT ADLT (ELECTROSURGICAL) ×1 IMPLANT
GLOVE BIOGEL PI IND STRL 8 (GLOVE) ×1 IMPLANT
GLOVE BIOGEL PI INDICATOR 8 (GLOVE) ×1
GLOVE ECLIPSE 8.0 STRL XLNG CF (GLOVE) ×2 IMPLANT
GOWN STRL REUS W/ TWL LRG LVL3 (GOWN DISPOSABLE) ×2 IMPLANT
GOWN STRL REUS W/TWL LRG LVL3 (GOWN DISPOSABLE) ×2
HEMOSTAT ARISTA ABSORB 3G PWDR (HEMOSTASIS) IMPLANT
HEMOSTAT SNOW SURGICEL 2X4 (HEMOSTASIS) IMPLANT
KIT MARKER MARGIN INK (KITS) ×2 IMPLANT
NEEDLE HYPO 25X1 1.5 SAFETY (NEEDLE) ×2 IMPLANT
NS IRRIG 1000ML POUR BTL (IV SOLUTION) ×2 IMPLANT
PACK BASIN DAY SURGERY FS (CUSTOM PROCEDURE TRAY) ×2 IMPLANT
PENCIL SMOKE EVACUATOR (MISCELLANEOUS) ×2 IMPLANT
SLEEVE SCD COMPRESS KNEE MED (MISCELLANEOUS) ×2 IMPLANT
SPONGE LAP 4X18 RFD (DISPOSABLE) ×2 IMPLANT
SUT MNCRL AB 4-0 PS2 18 (SUTURE) ×2 IMPLANT
SUT SILK 2 0 SH (SUTURE) IMPLANT
SUT VICRYL 3-0 CR8 SH (SUTURE) ×2 IMPLANT
SYR CONTROL 10ML LL (SYRINGE) ×2 IMPLANT
TOWEL GREEN STERILE FF (TOWEL DISPOSABLE) ×2 IMPLANT
TRAY FAXITRON CT DISP (TRAY / TRAY PROCEDURE) ×2 IMPLANT
TUBE CONNECTING 20X1/4 (TUBING) IMPLANT
YANKAUER SUCT BULB TIP NO VENT (SUCTIONS) IMPLANT

## 2019-11-12 NOTE — Anesthesia Preprocedure Evaluation (Signed)
Anesthesia Evaluation  Patient identified by MRN, date of birth, ID band Patient awake    Reviewed: Allergy & Precautions, H&P , NPO status , Patient's Chart, lab work & pertinent test results  History of Anesthesia Complications (+) PONV and history of anesthetic complications  Airway Mallampati: II   Neck ROM: full    Dental   Pulmonary    breath sounds clear to auscultation       Cardiovascular negative cardio ROS   Rhythm:regular Rate:Normal     Neuro/Psych  Headaches,    GI/Hepatic   Endo/Other    Renal/GU Renal diseasestones     Musculoskeletal  (+) Arthritis ,   Abdominal   Peds  Hematology   Anesthesia Other Findings   Reproductive/Obstetrics                             Anesthesia Physical Anesthesia Plan  ASA: II  Anesthesia Plan: General   Post-op Pain Management:    Induction: Intravenous  PONV Risk Score and Plan: 4 or greater and Ondansetron, Dexamethasone, Scopolamine patch - Pre-op, Midazolam and Treatment may vary due to age or medical condition  Airway Management Planned: LMA  Additional Equipment:   Intra-op Plan:   Post-operative Plan: Extubation in OR  Informed Consent: I have reviewed the patients History and Physical, chart, labs and discussed the procedure including the risks, benefits and alternatives for the proposed anesthesia with the patient or authorized representative who has indicated his/her understanding and acceptance.       Plan Discussed with: CRNA, Anesthesiologist and Surgeon  Anesthesia Plan Comments:         Anesthesia Quick Evaluation

## 2019-11-12 NOTE — Transfer of Care (Signed)
Immediate Anesthesia Transfer of Care Note  Patient: Preslea Brasch  Procedure(s) Performed: RIGHT BREAST LUMPECTOMY WITH RADIOACTIVE SEED LOCALIZATION (Right Breast)  Patient Location: PACU  Anesthesia Type:General  Level of Consciousness: awake, alert  and oriented  Airway & Oxygen Therapy: Patient Spontanous Breathing and Patient connected to face mask oxygen  Post-op Assessment: Report given to RN, Post -op Vital signs reviewed and stable and Patient moving all extremities X 4  Post vital signs: Reviewed and stable  Last Vitals:  Vitals Value Taken Time  BP 110/62 11/12/19 0937  Temp    Pulse 79 11/12/19 0939  Resp 17 11/12/19 0939  SpO2 100 % 11/12/19 0939  Vitals shown include unvalidated device data.  Last Pain:  Vitals:   11/12/19 0802  TempSrc: Tympanic  PainSc: 0-No pain         Complications: No apparent anesthesia complications

## 2019-11-12 NOTE — Anesthesia Postprocedure Evaluation (Signed)
Anesthesia Post Note  Patient: Kelly Francis  Procedure(s) Performed: RIGHT BREAST LUMPECTOMY WITH RADIOACTIVE SEED LOCALIZATION (Right Breast)     Patient location during evaluation: PACU Anesthesia Type: General Level of consciousness: awake and alert Pain management: pain level controlled Vital Signs Assessment: post-procedure vital signs reviewed and stable Respiratory status: spontaneous breathing, nonlabored ventilation, respiratory function stable and patient connected to nasal cannula oxygen Cardiovascular status: blood pressure returned to baseline and stable Postop Assessment: no apparent nausea or vomiting Anesthetic complications: no    Last Vitals:  Vitals:   11/12/19 0957 11/12/19 1015  BP: 115/71 112/79  Pulse: 72 70  Resp: 12 16  Temp:  36.5 C  SpO2: 98% 100%    Last Pain:  Vitals:   11/12/19 1015  TempSrc:   PainSc: 0-No pain                 Precious Segall S

## 2019-11-12 NOTE — Anesthesia Procedure Notes (Signed)
Procedure Name: LMA Insertion Date/Time: 11/12/2019 8:47 AM Performed by: Mariea Clonts, CRNA Pre-anesthesia Checklist: Patient identified, Emergency Drugs available, Suction available and Patient being monitored Patient Re-evaluated:Patient Re-evaluated prior to induction Oxygen Delivery Method: Circle System Utilized Preoxygenation: Pre-oxygenation with 100% oxygen Induction Type: IV induction Ventilation: Mask ventilation without difficulty LMA: LMA inserted LMA Size: 4.0 Number of attempts: 1 Airway Equipment and Method: Bite block Placement Confirmation: positive ETCO2 Tube secured with: Tape Dental Injury: Teeth and Oropharynx as per pre-operative assessment

## 2019-11-12 NOTE — Discharge Instructions (Signed)
[facility] °Office Phone Number [phone] ° °BREAST BIOPSY/ PARTIAL MASTECTOMY: POST OP INSTRUCTIONS ° °Always review your discharge instruction sheet given to you by the facility where your surgery was performed. ° °IF YOU HAVE DISABILITY OR FAMILY LEAVE FORMS, YOU MUST BRING THEM TO THE OFFICE FOR PROCESSING.  DO NOT GIVE THEM TO YOUR DOCTOR. ° °1. A prescription for pain medication may be given to you upon discharge.  Take your pain medication as prescribed, if needed.  If narcotic pain medicine is not needed, then you may take acetaminophen (Tylenol) or ibuprofen (Advil) as needed. °2. Take your usually prescribed medications unless otherwise directed °3. If you need a refill on your pain medication, please contact your pharmacy.  They will contact our office to request authorization.  Prescriptions will not be filled after 5pm or on week-ends. °4. You should eat very light the first 24 hours after surgery, such as soup, crackers, pudding, etc.  Resume your normal diet the day after surgery. °5. Most patients will experience some swelling and bruising in the breast.  Ice packs and a good support bra will help.  Swelling and bruising can take several days to resolve.  °6. It is common to experience some constipation if taking pain medication after surgery.  Increasing fluid intake and taking a stool softener will usually help or prevent this problem from occurring.  A mild laxative (Milk of Magnesia or Miralax) should be taken according to package directions if there are no bowel movements after 48 hours. °7. Unless discharge instructions indicate otherwise, you may remove your bandages 24-48 hours after surgery, and you may shower at that time.  You may have steri-strips (small skin tapes) in place directly over the incision.  These strips should be left on the skin for 7-10 days.  If your surgeon used skin glue on the incision, you may shower in 24 hours.  The glue will flake off over the  next 2-3 weeks.  Any sutures or staples will be removed at the office during your follow-up visit. °8. ACTIVITIES:  You may resume regular daily activities (gradually increasing) beginning the next day.  Wearing a good support bra or sports bra minimizes pain and swelling.  You may have sexual intercourse when it is comfortable. °a. You may drive when you no longer are taking prescription pain medication, you can comfortably wear a seatbelt, and you can safely maneuver your car and apply brakes. °b. RETURN TO WORK:  ______________________________________________________________________________________ °9. You should see your doctor in the office for a follow-up appointment approximately two weeks after your surgery.  Your doctor’s nurse will typically make your follow-up appointment when she calls you with your pathology report.  Expect your pathology report 2-3 business days after your surgery.  You may call to check if you do not hear from us after three days. °10. OTHER INSTRUCTIONS: _______________________________________________________________________________________________ _____________________________________________________________________________________________________________________________________ °_____________________________________________________________________________________________________________________________________ °_____________________________________________________________________________________________________________________________________ ° °WHEN TO CALL YOUR DOCTOR: °1. Fever over 101.0 °2. Nausea and/or vomiting. °3. Extreme swelling or bruising. °4. Continued bleeding from incision. °5. Increased pain, redness, or drainage from the incision. ° °The clinic staff is available to answer your questions during regular business hours.  Please don’t hesitate to call and ask to speak to one of the nurses for clinical concerns.  If you have a medical emergency, go to the nearest  emergency room or call 911.  A surgeon from [facility] is always on call at the hospital. ° °For further questions, please visit [email or web address]  ° ° ° ° °  Post Anesthesia Home Care Instructions ° °Activity: °Get plenty of rest for the remainder of the day. A responsible individual must stay with you for 24 hours following the procedure.  °For the next 24 hours, DO NOT: °-Drive a car °-Operate machinery °-Drink alcoholic beverages °-Take any medication unless instructed by your physician °-Make any legal decisions or sign important papers. ° °Meals: °Start with liquid foods such as gelatin or soup. Progress to regular foods as tolerated. Avoid greasy, spicy, heavy foods. If nausea and/or vomiting occur, drink only clear liquids until the nausea and/or vomiting subsides. Call your physician if vomiting continues. ° °Special Instructions/Symptoms: °Your throat may feel dry or sore from the anesthesia or the breathing tube placed in your throat during surgery. If this causes discomfort, gargle with warm salt water. The discomfort should disappear within 24 hours. ° °If you had a scopolamine patch placed behind your ear for the management of post- operative nausea and/or vomiting: ° °1. The medication in the patch is effective for 72 hours, after which it should be removed.  Wrap patch in a tissue and discard in the trash. Wash hands thoroughly with soap and water. °2. You may remove the patch earlier than 72 hours if you experience unpleasant side effects which may include dry mouth, dizziness or visual disturbances. °3. Avoid touching the patch. Wash your hands with soap and water after contact with the patch. °  ° °

## 2019-11-12 NOTE — Op Note (Signed)
Preoperative diagnosis: Right breast papilloma  Postoperative diagnosis: Same  Procedure: Right breast seed localized lumpectomy  Surgeon: Erroll Luna, MD  Assistant [facility]  Anesthesia: LMA with local consisting of 0.5% Marcaine  EBL: Minimal  Specimen: Right breast tissue with seed and clip verified by Faxitron  Drains: None  Indications for procedure: Patient presents for right breast lumpectomy for papilloma.  Risk, benefits and other surgical options and medical options of management discussed.  Potential risk malignancy was up to 20% and this was discussed as rationale for removing lesion.The procedure has been discussed with the patient. Alternatives to surgery have been discussed with the patient.  Risks of surgery include bleeding,  Infection,  Seroma formation, death,  and the need for further surgery.   The patient understands and wishes to proceed.  Description of procedure: The patient was met in the holding area and questions were answered.  Neoprobe was used to verify seed location in the right breast.  She was taken back to the operating.  She is placed supine upon the OR table.  After induction of general esthesia, right breast was prepped and draped in sterile fashion timeout was performed.  Films were available for review.  Curvilinear incision made right breast upper outer quadrant.  Dissection carried down all tissue and the seed clip were excised with a grossly negative margin.  Cavities made hemostatic with cautery was irrigated and local anesthetic was infiltrated.  It was then closed with 3-0 Vicryl and 4-0 Monocryl.  Images showed seed and clip to be in tissue specimen was sent on to pathology.  Dermabond applied.  All final counts found to be correct.  Breast binder placed.  The patient was awoke extubated taken to recovery in satisfactory condition.

## 2019-11-12 NOTE — Interval H&P Note (Signed)
History and Physical Interval Note:  11/12/2019 8:27 AM  Kelly Francis  has presented today for surgery, with the diagnosis of RIGHT BREAST PAPILLOMA.  The various methods of treatment have been discussed with the patient and family. After consideration of risks, benefits and other options for treatment, the patient has consented to  Procedure(s): RIGHT BREAST LUMPECTOMY WITH RADIOACTIVE SEED LOCALIZATION (Right) as a surgical intervention.  The patient's history has been reviewed, patient examined, no change in status, stable for surgery.  I have reviewed the patient's chart and labs.  Questions were answered to the patient's satisfaction.     [name]

## 2019-11-13 LAB — SURGICAL PATHOLOGY

## 2019-11-15 ENCOUNTER — Encounter: Payer: Self-pay | Admitting: Family Medicine

## 2020-02-05 DIAGNOSIS — Z23 Encounter for immunization: Secondary | ICD-10-CM | POA: Diagnosis not present

## 2020-02-26 DIAGNOSIS — Z23 Encounter for immunization: Secondary | ICD-10-CM | POA: Diagnosis not present

## 2020-04-14 DIAGNOSIS — G43719 Chronic migraine without aura, intractable, without status migrainosus: Secondary | ICD-10-CM | POA: Diagnosis not present

## 2020-04-14 DIAGNOSIS — G43019 Migraine without aura, intractable, without status migrainosus: Secondary | ICD-10-CM | POA: Diagnosis not present

## 2020-06-14 ENCOUNTER — Other Ambulatory Visit: Payer: Self-pay | Admitting: Sports Medicine

## 2020-06-14 DIAGNOSIS — M25552 Pain in left hip: Secondary | ICD-10-CM | POA: Diagnosis not present

## 2020-06-14 DIAGNOSIS — M25551 Pain in right hip: Secondary | ICD-10-CM | POA: Diagnosis not present

## 2020-06-14 DIAGNOSIS — M545 Low back pain, unspecified: Secondary | ICD-10-CM

## 2020-06-14 DIAGNOSIS — M47816 Spondylosis without myelopathy or radiculopathy, lumbar region: Secondary | ICD-10-CM | POA: Diagnosis not present

## 2020-07-01 IMAGING — US US AXILLARY LEFT
1 series · 4 of 4 positions shown · non-contrast
Comparison: Previous exam(s).

ACR Breast Density Category a: The breast tissue is almost entirely
fatty.

CLINICAL DATA: [age]-year-old female presenting for evaluation
an area of swelling in the left axilla.

EXAM:
DIGITAL DIAGNOSTIC LEFT MAMMOGRAM WITH CAD AND TOMO
ULTRASOUND LEFT AXILLA

[Series 1: us axillary left · 0.08mm/px · 4 of 4 slices shown]
[im 1/4]
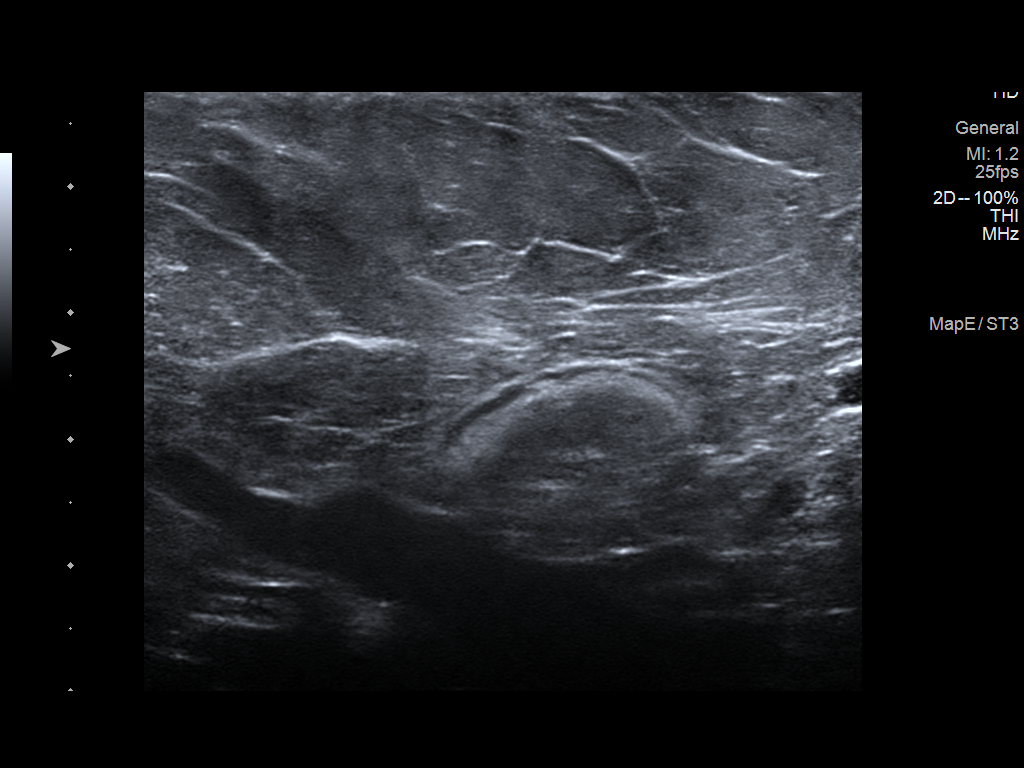
[im 2/4]
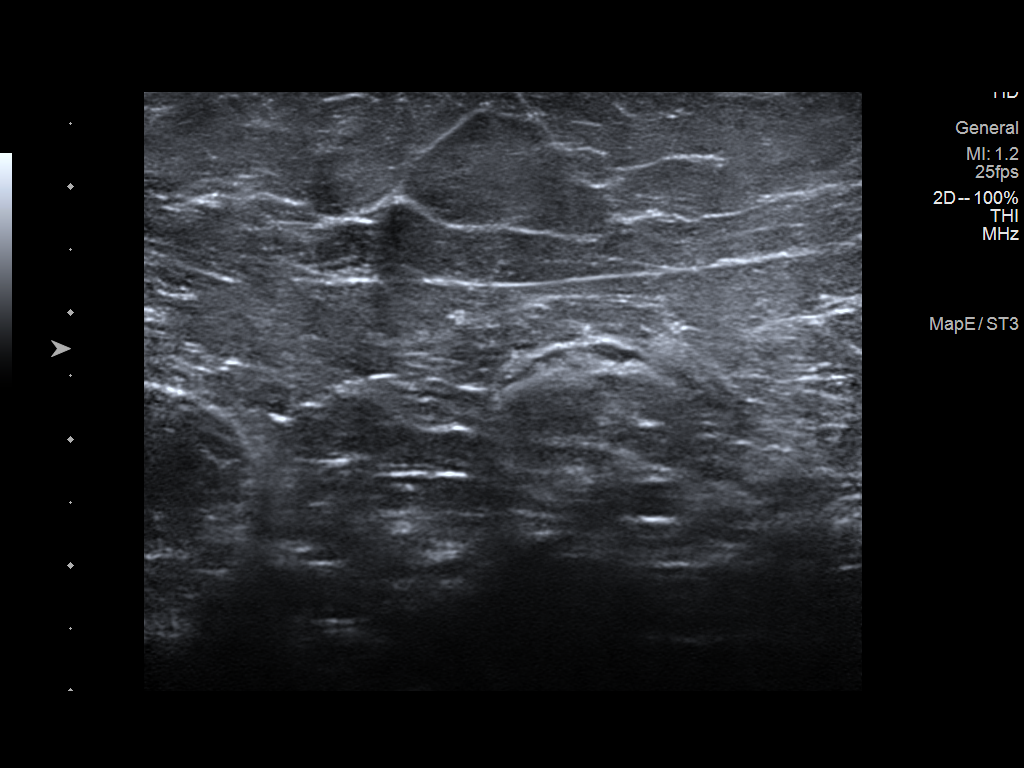
[im 3/4]
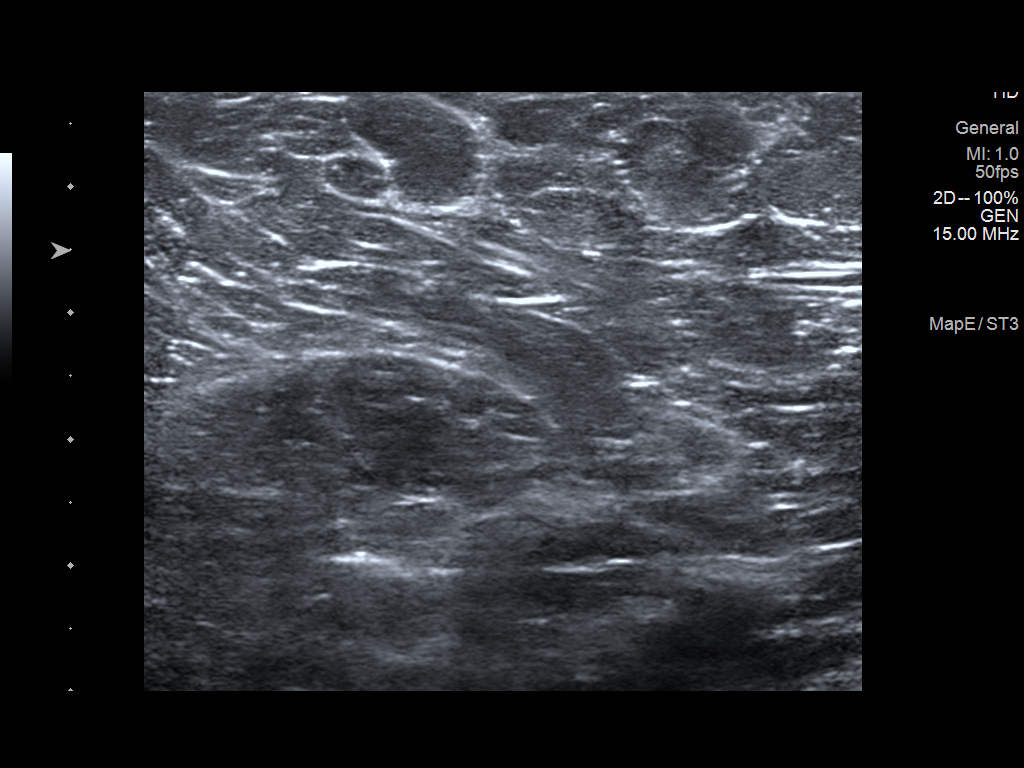
[im 4/4]
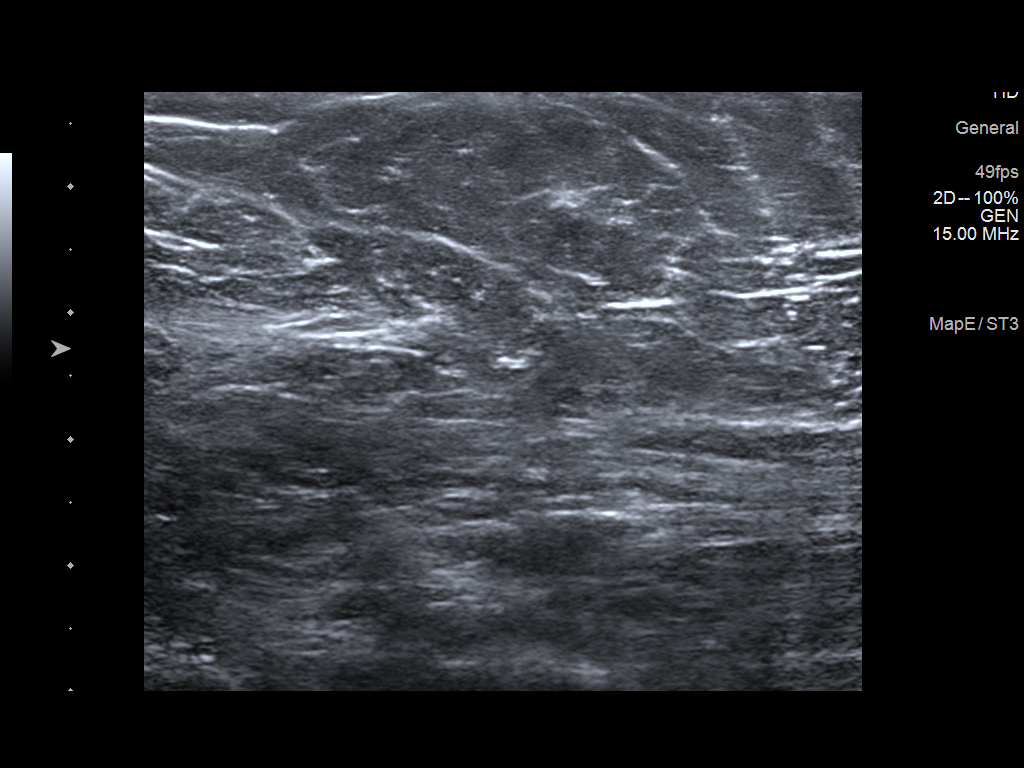

[4 of 4 positions shown; findings below may reference images not displayed]

FINDINGS: A BB has been placed over the left axilla indicating the palpable
site of concern. There are no suspicious mammographic findings deep
to the marker. No suspicious calcifications, masses or areas of
distortion are seen in the left breast.

Mammographic images were processed with CAD.

Physical exam of the palpable site in the left axilla demonstrates
no discrete palpable masses.

Ultrasound targeted to the left axilla, demonstrates normal
subcutaneous tissue. No masses or suspicious areas of shadowing are
identified.
IMPRESSION: 1. There are no mammographic or targeted sonographic abnormalities
at the palpable site of swelling in the left axilla.

RECOMMENDATION:
1. The patient should proceed with her surgical consultation for the
biopsied papilloma in the right breast.

2. Return to routine screening mammography is recommended. The
patient will be due for screening in Thursday August, 2020.

I have discussed the findings and recommendations with the patient.
If applicable, a reminder letter will be sent to the patient
regarding the next appointment.

BI-RADS CATEGORY  1: Negative.

## 2020-07-01 IMAGING — MG MM DIGITAL DIAGNOSTIC UNILAT*L* W/ TOMO W/ CAD
6 series · 6 of 18 positions shown · non-contrast
Comparison: Previous exam(s).

ACR Breast Density Category a: The breast tissue is almost entirely
fatty.

CLINICAL DATA: [age]-year-old female presenting for evaluation
an area of swelling in the left axilla.

EXAM:
DIGITAL DIAGNOSTIC LEFT MAMMOGRAM WITH CAD AND TOMO
ULTRASOUND LEFT AXILLA

[L MLO synth-2D (1 of 3)]
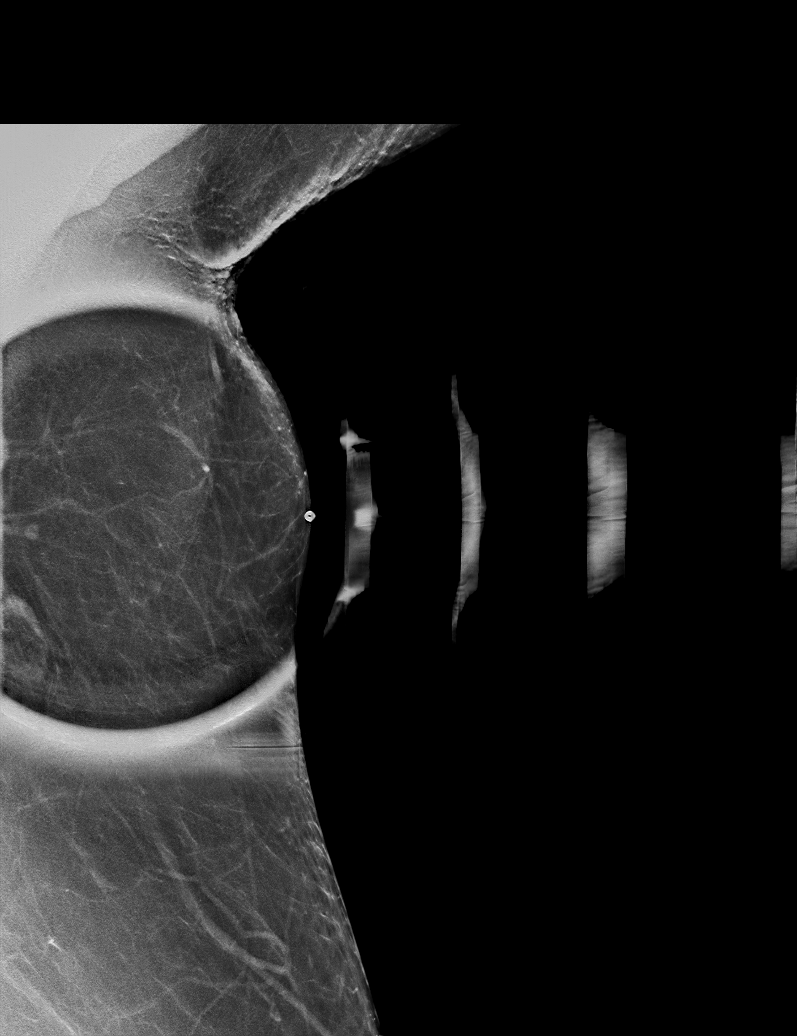

[L MLO synth-2D (2 of 3)]
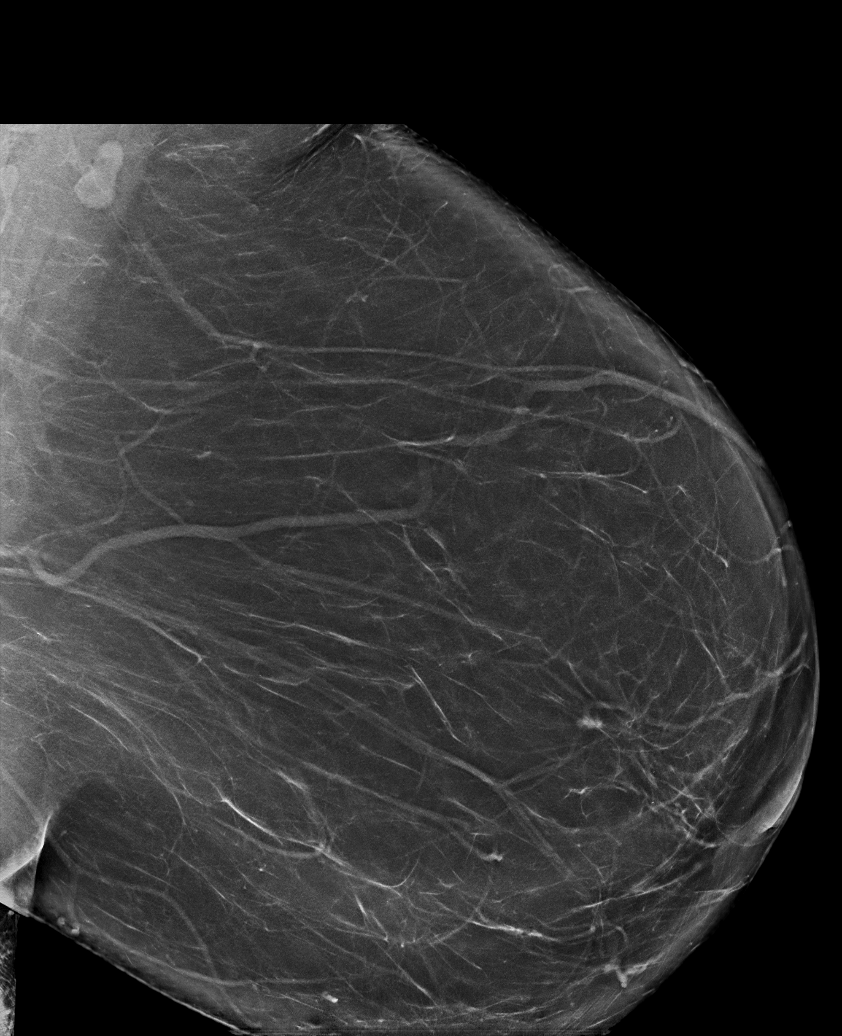

[L MLO synth-2D (3 of 3)]
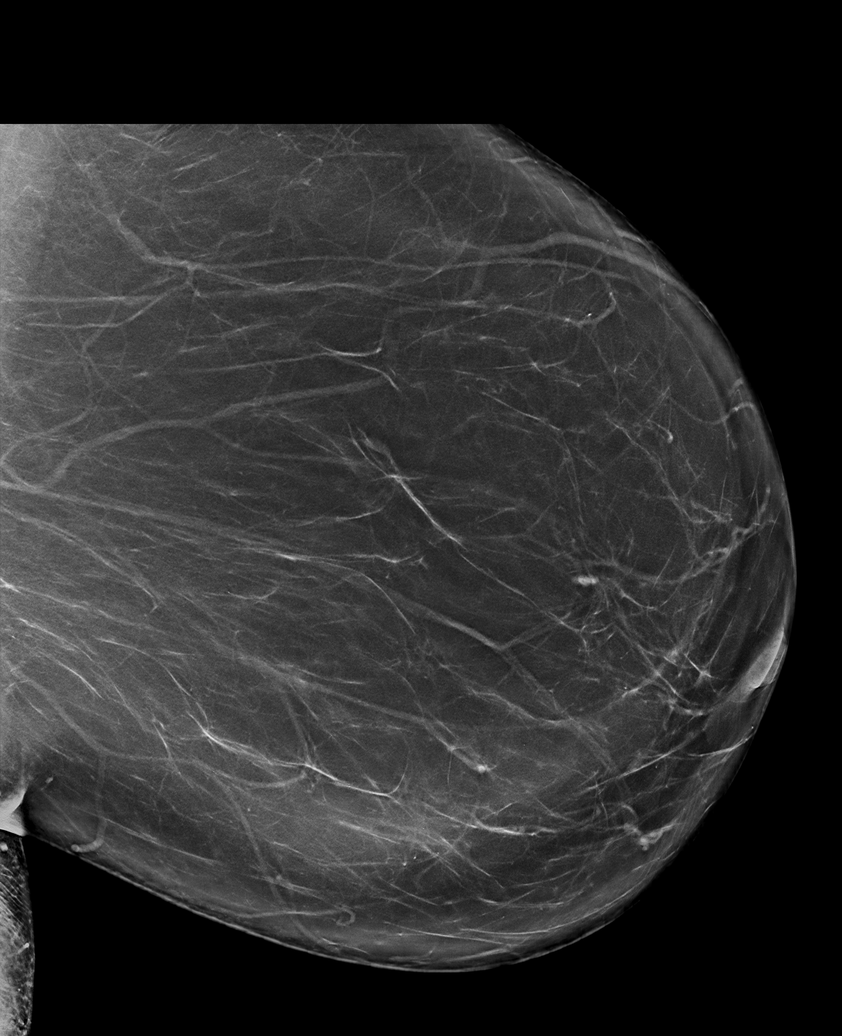

[L MLO tomo (1 of 3) · tomo slice 49/96.0]
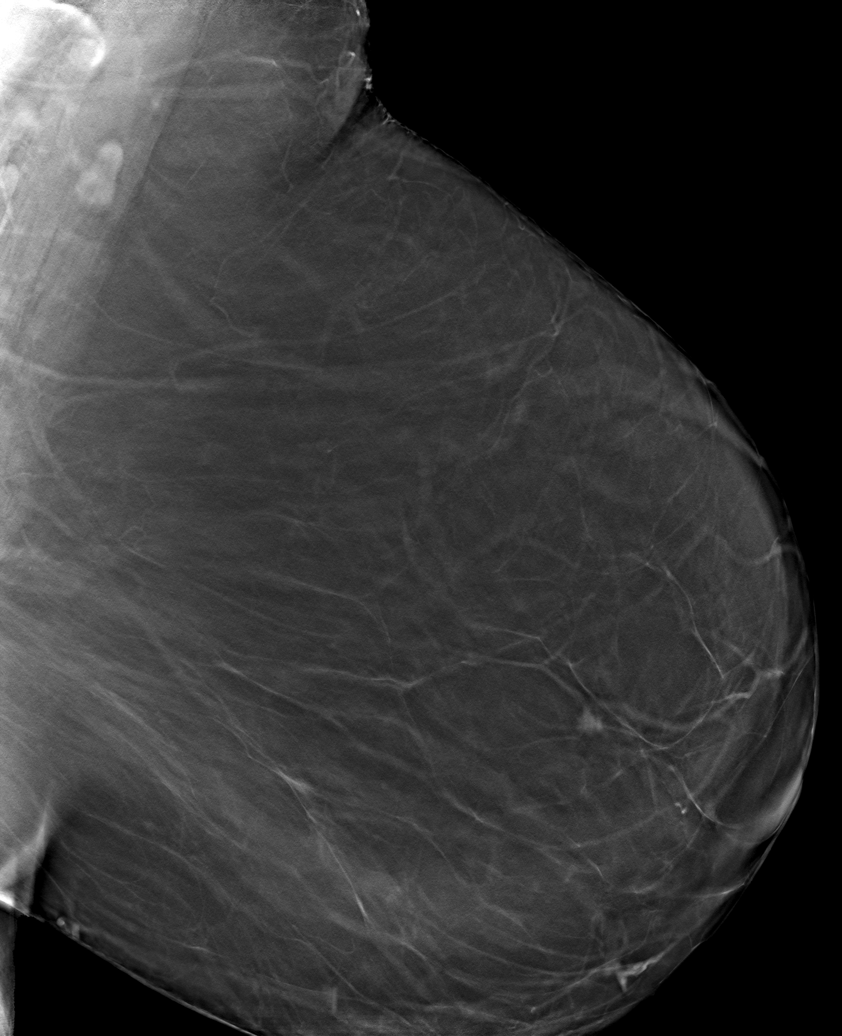

[L MLO tomo (2 of 3) · tomo slice 47/94.0]
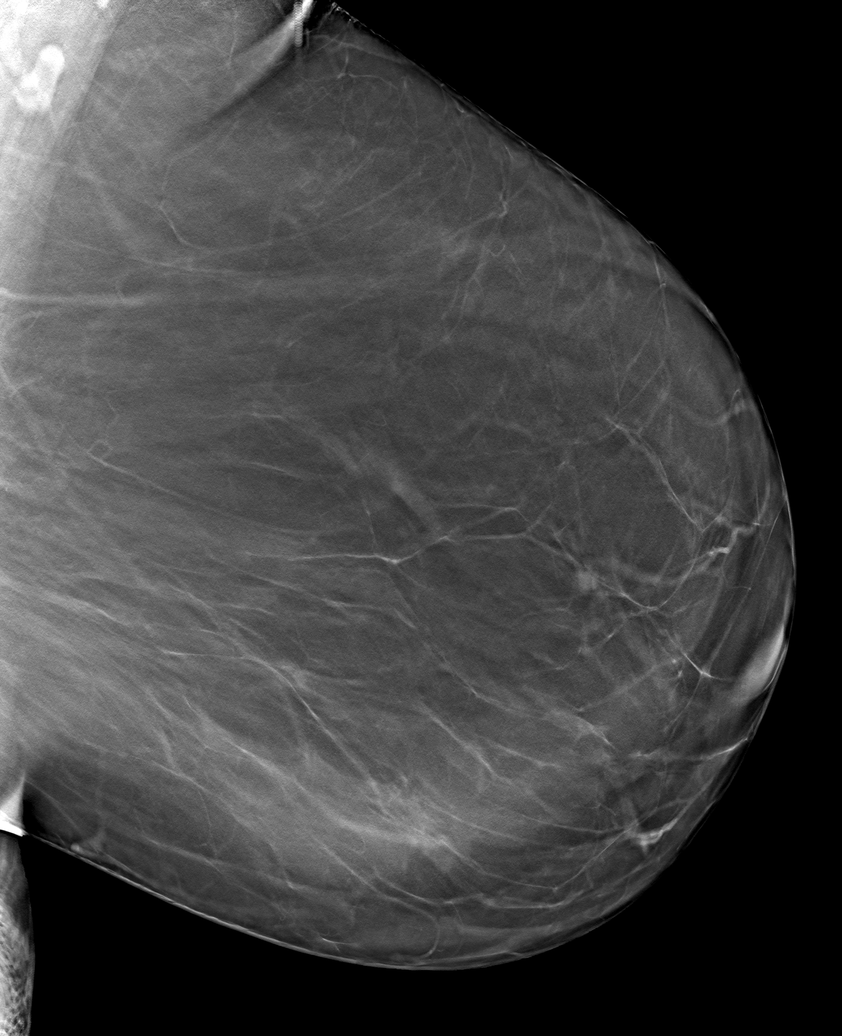

[L MLO tomo (3 of 3) · tomo slice 40/79.0]
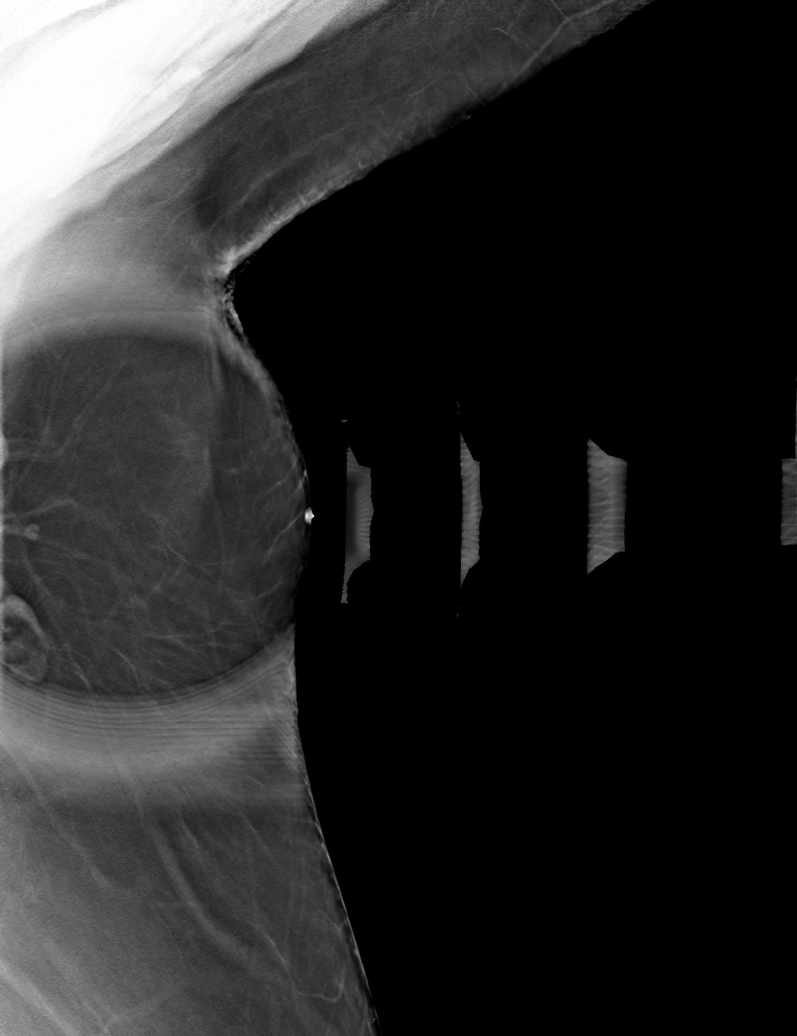

[6 of 18 positions shown; findings below may reference images not displayed]

FINDINGS: A BB has been placed over the left axilla indicating the palpable
site of concern. There are no suspicious mammographic findings deep
to the marker. No suspicious calcifications, masses or areas of
distortion are seen in the left breast.

Mammographic images were processed with CAD.

Physical exam of the palpable site in the left axilla demonstrates
no discrete palpable masses.

Ultrasound targeted to the left axilla, demonstrates normal
subcutaneous tissue. No masses or suspicious areas of shadowing are
identified.
IMPRESSION: 1. There are no mammographic or targeted sonographic abnormalities
at the palpable site of swelling in the left axilla.

RECOMMENDATION:
1. The patient should proceed with her surgical consultation for the
biopsied papilloma in the right breast.

2. Return to routine screening mammography is recommended. The
patient will be due for screening in Thursday August, 2020.

I have discussed the findings and recommendations with the patient.
If applicable, a reminder letter will be sent to the patient
regarding the next appointment.

BI-RADS CATEGORY  1: Negative.

## 2020-07-10 ENCOUNTER — Other Ambulatory Visit: Payer: Self-pay

## 2020-07-10 ENCOUNTER — Ambulatory Visit
Admission: RE | Admit: 2020-07-10 | Discharge: 2020-07-10 | Disposition: A | Discharge status: Home / Self Care | Payer: BC Managed Care – PPO | Source: Ambulatory Visit | Attending: Sports Medicine | Admitting: Sports Medicine

## 2020-07-10 DIAGNOSIS — M545 Low back pain, unspecified: Secondary | ICD-10-CM

## 2020-07-10 DIAGNOSIS — M48061 Spinal stenosis, lumbar region without neurogenic claudication: Secondary | ICD-10-CM | POA: Diagnosis not present

## 2020-07-14 HISTORY — PX: FACET JOINT INJECTION: SHX5016

## 2020-07-21 DIAGNOSIS — M25552 Pain in left hip: Secondary | ICD-10-CM | POA: Diagnosis not present

## 2020-07-21 DIAGNOSIS — M25551 Pain in right hip: Secondary | ICD-10-CM | POA: Diagnosis not present

## 2020-07-27 DIAGNOSIS — M47816 Spondylosis without myelopathy or radiculopathy, lumbar region: Secondary | ICD-10-CM | POA: Diagnosis not present

## 2020-08-13 DIAGNOSIS — Z20822 Contact with and (suspected) exposure to covid-19: Secondary | ICD-10-CM | POA: Diagnosis not present

## 2020-08-19 DIAGNOSIS — M47816 Spondylosis without myelopathy or radiculopathy, lumbar region: Secondary | ICD-10-CM | POA: Diagnosis not present

## 2020-08-30 ENCOUNTER — Other Ambulatory Visit: Payer: Self-pay | Admitting: Family

## 2020-08-30 DIAGNOSIS — Z1231 Encounter for screening mammogram for malignant neoplasm of breast: Secondary | ICD-10-CM

## 2020-09-24 ENCOUNTER — Ambulatory Visit
Admission: RE | Admit: 2020-09-24 | Discharge: 2020-09-24 | Disposition: A | Discharge status: Home / Self Care | Payer: BC Managed Care – PPO | Source: Ambulatory Visit | Attending: Family | Admitting: Family

## 2020-09-24 ENCOUNTER — Other Ambulatory Visit: Payer: Self-pay

## 2020-09-24 DIAGNOSIS — Z1231 Encounter for screening mammogram for malignant neoplasm of breast: Secondary | ICD-10-CM

## 2020-10-26 DIAGNOSIS — G43719 Chronic migraine without aura, intractable, without status migrainosus: Secondary | ICD-10-CM | POA: Diagnosis not present

## 2020-10-26 DIAGNOSIS — G43019 Migraine without aura, intractable, without status migrainosus: Secondary | ICD-10-CM | POA: Diagnosis not present

## 2020-12-30 ENCOUNTER — Telehealth: Payer: Self-pay | Admitting: Family Medicine

## 2020-12-30 NOTE — Telephone Encounter (Signed)
Patient Called in wanting to schedule an appt for a physical. Looks like she hasn't been here since 2018. Would you be willing to reestablish her as your patient? Please advise. EM

## 2020-12-30 NOTE — Telephone Encounter (Signed)
Ok to do.  However notify if she wants to reestablish with me may take a while to get in for physical and would offer establishing with another provider if sooner available with them.

## 2021-01-03 ENCOUNTER — Telehealth: Payer: Self-pay

## 2021-01-03 NOTE — Telephone Encounter (Signed)
Pt last seen by DR G on 05/03/2017.already has appt with Dr Darnell Level on 01/05/21 at 8 AM which Denisha said was approved by [facility] CMA.. Pt will have someone with her at night and will get foods to eat during the night if sugar drops low; pt said she has never been dx with diabetes and is not on any meds for diabetes and has not had problem like this before. UC & ED precautions given and pt voiced understanding. Sending note to DR Baldwin Crown CMA. Pt also scheduled on 01/12/21 for reestablish appt with DR G.

## 2021-01-03 NOTE — Telephone Encounter (Signed)
[place] [facility] - Client TELEPHONE ADVICE RECORD [facility] Patient Name: Kelly Francis Gender: Female DOB: 25-Aug-1956 Age: 65 Y 16 D Return Phone Number: [phone] (Primary) Address: City/State/Zip:  Client [name] - Client Client Site [place] Physician [name] - MD Contact Type Call Who Is Calling Patient / Member / Family / Caregiver Call Type Triage / Clinical Relationship To Patient Self Return Phone Number [phone] (Primary) Chief Complaint Blood Sugar Low Reason for Call Symptomatic / Request for [place] states her blood sugar is really low at 98. She is nauseated and her face is flushed. Translation No Nurse Assessment Nurse: Reasor, RN, Leah Date/Time (Eastern Time): 01/03/2021 3:50:45 PM Confirm and document reason for call. If symptomatic, describe symptoms. ---Caller states her blood sugar is really low at 98, c/o nausea, fatigue, and her face is flushed. Pt states she is COVID negative. Does the patient have any new or worsening symptoms? ---Yes Will a triage be completed? ---Yes Related visit to physician within the last 2 weeks? ---No Does the PT have any chronic conditions? (i.e. diabetes, asthma, this includes High risk factors for pregnancy, etc.) ---Yes List chronic conditions. ---migraines GERD HRT high cholesterol Is this a behavioral health or substance abuse call? ---No Guidelines Guideline Title Affirmed Question Affirmed Notes Nurse Date/Time (Eastern Time) Diabetes - Low Blood Sugar [1] Blood glucose < 70 mg/dL (3.9 mmol/L) or symptomatic, now improved with Care Advice AND [2] cause unknown Reasor, RN, Denny Peon 01/03/2021 4:00:03 PM Disp. Time Eilene Ghazi Time) Disposition Final User 01/03/2021 3:30:56 PM Send To RN Personal Jearld Pies, RN, Lovena Le 01/03/2021 3:44:19 PM Attempt made - message left Reasor, RN,  Denny Peon 01/03/2021 4:04:23 PM Call PCP within 24 Hours Yes Reasor, RN, Denny Peon PLEASE NOTE: All timestamps contained within this report are represented as [place] Standard Time. CONFIDENTIALTY NOTICE: This fax transmission is intended only for the addressee. It contains information that is legally privileged, confidential or otherwise protected from use or disclosure. If you are not the intended recipient, you are strictly prohibited from reviewing, disclosing, copying using or disseminating any of this information or taking any action in reliance on or regarding this information. If you have received this fax in error, please notify us immediately by telephone so that we can arrange for its return to [place]. Phone: [phone], Toll-Free: [phone], Fax: [phone] Page: 2 of 2 Call Id: [id number] [place] Disagree/Comply Comply Caller Understands Yes PreDisposition InappropriateToAsk Care Advice Given Per Guideline CALL PCP WITHIN 24 HOURS: * IF OFFICE WILL BE OPEN: Call the office when it opens tomorrow morning. LOW BLOOD SUGAR (HYPOGLYCEMIA) - DEFINITION: * GLUCOSE TABLETS (3 to 4 tablets); preferred, if available * Table sugar or honey (3 teaspoons; 15 ml) * Skittles candy (15) * Pre-packaged juice box (1 box) * After the symptoms resolve, eat a small snack to prevent this from recurring. Examples include: cheese and crackers, a glass of milk, or half a sandwich. CALL BACK IF: * You become worse CARE ADVICE given per Diabetes - Low Blood Sugar (Adult) guideline. Referrals REFERRED TO PCP OFFICE

## 2021-01-04 NOTE — Telephone Encounter (Signed)
I spoke with pt; today FBS at 4:30 was 101 drank juice; last BS at 8 AM 123. Pt still wants to keep appt with Dr on 01/05/21 at 8 AM/. UC & ED precautions given again and pt voiced understanding. Pt will continue to monitor BS. Sending note to Dr Darnell Level and Lattie Haw CMA.

## 2021-01-04 NOTE — Telephone Encounter (Signed)
Noted  

## 2021-01-04 NOTE — Telephone Encounter (Signed)
[place] [facility] - Client TELEPHONE ADVICE RECORD [facility] Patient Name: Kelly Francis Gender: Female DOB: 09/21/1956 Age: 65 Y 17 D Return Phone Number: [phone] (Primary) Address: City/State/Zip: [place] [address] Client [facility] - Client Client Site [place] Physician [name] - MD Contact Type Call Who Is Calling Patient / Member / Family / Caregiver Call Type Triage / Clinical Relationship To Patient Self Return Phone Number [phone] (Primary) Chief Complaint Blood Sugar Low Reason for Call Symptomatic / Request for [place] spoke to triage nurse earlier regarding blood sugar. Caller believes she is hypoglycemic, sugar was 91. Caller ate hard candy, ginger ale, 2 tsp of honey, grilled chicken with honey sauce, french fries, sugar is 113. Caller states sugar is not going up. She is calling because she is concerned because she does not want to go under 100 overnight. Translation No Nurse Assessment Nurse: Tarasoff, RN, Gay Filler Date/Time (Eastern Time): 01/03/2021 7:23:39 PM Confirm and document reason for call. If symptomatic, describe symptoms. ---caller states: spoke to triage nurse earlier today and they told her what to do about her blood sugar; 2:21 BS 98; 6 pm BP 91, ate 2 tsp honey, chicken sandwich, few french fries and 30 mins later BS is 113. Does the patient have any new or worsening symptoms? ---Yes Will a triage be completed? ---Yes Related visit to physician within the last 2 weeks? ---No Does the PT have any chronic conditions? (i.e. diabetes, asthma, this includes High risk factors for pregnancy, etc.) ---Yes List chronic conditions. ---migraines Is this a behavioral health or substance abuse call? ---No Guidelines Guideline Title Affirmed Question Affirmed Notes Nurse Date/Time (Eastern Time) Diabetes - Low  Blood Sugar Low blood sugar definition and treatment, questions about Tarasoff, RN, Gay Filler 01/03/2021 7:28:25 PM Disp. Time Eilene Ghazi Time) Disposition Final User 01/03/2021 7:38:12 PM Home Care Yes Tarasoff, RN, Gay Filler PLEASE NOTE: All timestamps contained within this report are represented as [place] Standard Time. CONFIDENTIALTY NOTICE: This fax transmission is intended only for the addressee. It contains information that is legally privileged, confidential or otherwise protected from use or disclosure. If you are not the intended recipient, you are strictly prohibited from reviewing, disclosing, copying using or disseminating any of this information or taking any action in reliance on or regarding this information. If you have received this fax in error, please notify us immediately by telephone so that we can arrange for its return to [place]. Phone: [phone], Toll-Free: [phone], Fax: [phone] Page: 2 of 2 Call Id: [id number] [place] Disagree/Comply Comply Caller Understands Yes PreDisposition Home Care Care Advice Given Per Guideline HOME CARE: * You should be able to treat this at home. * Milk (1 cup; 240 ml) * Fruit juice or non-diet soda (1/2 cup; 120 ml) * Pre-packaged juice box (1 box) CALL BACK IF: * You have more questions. * You become worse CARE ADVICE given per Diabetes - Low Blood Sugar (Adult) guideline. Comments User: Landry Dyke, RN Date/Time Eilene Ghazi Time): 01/03/2021 7:38:07 PM [email or web address] %20condition%20in,who%20don't%20have%20diabetes

## 2021-01-05 ENCOUNTER — Other Ambulatory Visit: Payer: Self-pay

## 2021-01-05 ENCOUNTER — Ambulatory Visit: Payer: BC Managed Care – PPO | Admitting: Family Medicine

## 2021-01-05 ENCOUNTER — Encounter: Payer: Self-pay | Admitting: Family Medicine

## 2021-01-05 VITALS — BP 130/78 | HR 86 | Temp 98.0°F | Ht 63.0 in | Wt 214.1 lb

## 2021-01-05 DIAGNOSIS — S40262S Insect bite (nonvenomous) of left shoulder, sequela: Secondary | ICD-10-CM

## 2021-01-05 DIAGNOSIS — E785 Hyperlipidemia, unspecified: Secondary | ICD-10-CM

## 2021-01-05 DIAGNOSIS — R5383 Other fatigue: Secondary | ICD-10-CM

## 2021-01-05 DIAGNOSIS — W57XXXA Bitten or stung by nonvenomous insect and other nonvenomous arthropods, initial encounter: Secondary | ICD-10-CM | POA: Insufficient documentation

## 2021-01-05 DIAGNOSIS — Z79890 Hormone replacement therapy (postmenopausal): Secondary | ICD-10-CM

## 2021-01-05 DIAGNOSIS — W57XXXS Bitten or stung by nonvenomous insect and other nonvenomous arthropods, sequela: Secondary | ICD-10-CM

## 2021-01-05 DIAGNOSIS — S40262A Insect bite (nonvenomous) of left shoulder, initial encounter: Secondary | ICD-10-CM | POA: Insufficient documentation

## 2021-01-05 LAB — CBC WITH DIFFERENTIAL/PLATELET
Basophils Absolute: 0 10*3/uL (ref 0.0–0.1)
Basophils Relative: 0.6 % (ref 0.0–3.0)
Eosinophils Absolute: 0 10*3/uL (ref 0.0–0.7)
Eosinophils Relative: 0.4 % (ref 0.0–5.0)
HCT: 43.3 % (ref 36.0–46.0)
Hemoglobin: 14.3 g/dL (ref 12.0–15.0)
Lymphocytes Relative: 28.2 % (ref 12.0–46.0)
Lymphs Abs: 1.8 10*3/uL (ref 0.7–4.0)
MCHC: 33 g/dL (ref 30.0–36.0)
MCV: 89.1 fl (ref 78.0–100.0)
Monocytes Absolute: 0.5 10*3/uL (ref 0.1–1.0)
Monocytes Relative: 7.2 % (ref 3.0–12.0)
Neutro Abs: 4 10*3/uL (ref 1.4–7.7)
Neutrophils Relative %: 63.6 % (ref 43.0–77.0)
Platelets: 193 10*3/uL (ref 150.0–400.0)
RBC: 4.86 Mil/uL (ref 3.87–5.11)
RDW: 14.7 % (ref 11.5–15.5)
WBC: 6.3 10*3/uL (ref 4.0–10.5)

## 2021-01-05 LAB — TSH: TSH: 1.38 u[IU]/mL (ref 0.35–4.50)

## 2021-01-05 LAB — LIPID PANEL
Cholesterol: 221 mg/dL — ABNORMAL HIGH (ref 0–200)
HDL: 74.6 mg/dL (ref >39.00)
LDL Cholesterol: 118 mg/dL — ABNORMAL HIGH (ref 0–99)
NonHDL: 145.98
Total CHOL/HDL Ratio: 3
Triglycerides: 141 mg/dL (ref 0.0–149.0)
VLDL: 28.2 mg/dL (ref 0.0–40.0)

## 2021-01-05 LAB — COMPREHENSIVE METABOLIC PANEL
ALT: 32 U/L (ref 0–35)
AST: 34 U/L (ref 0–37)
Albumin: 4.3 g/dL (ref 3.5–5.2)
Alkaline Phosphatase: 102 U/L (ref 39–117)
BUN: 20 mg/dL (ref 6–23)
CO2: 26 mEq/L (ref 19–32)
Calcium: 9.2 mg/dL (ref 8.4–10.5)
Chloride: 104 mEq/L (ref 96–112)
Creatinine, Ser: 0.58 mg/dL (ref 0.40–1.20)
GFR: 96.56 mL/min (ref >60.00)
Glucose, Bld: 94 mg/dL (ref 70–99)
Potassium: 4.2 mEq/L (ref 3.5–5.1)
Sodium: 139 mEq/L (ref 135–145)
Total Bilirubin: 0.5 mg/dL (ref 0.2–1.2)
Total Protein: 7 g/dL (ref 6.0–8.3)

## 2021-01-05 LAB — FOLATE: Folate: >23.6 ng/mL (ref >5.9)

## 2021-01-05 LAB — SEDIMENTATION RATE: Sed Rate: 13 mm/hr (ref 0–30)

## 2021-01-05 LAB — VITAMIN B12: Vitamin B-12: 558 pg/mL (ref 211–911)

## 2021-01-05 LAB — VITAMIN D 25 HYDROXY (VIT D DEFICIENCY, FRACTURES): VITD: 18.18 ng/mL — ABNORMAL LOW (ref 30.00–100.00)

## 2021-01-05 MED ORDER — OMEPRAZOLE 40 MG PO CPDR: 40.0000 mg | DELAYED_RELEASE_CAPSULE | Freq: Every day | ORAL | 6 refills | Status: DC

## 2021-01-05 NOTE — Assessment & Plan Note (Signed)
Ongoing - check labs for reversible causes.  No true hypoglycemia however sugars don't seem to be rising as expected after meal/snacks. Check labwork today.

## 2021-01-05 NOTE — Assessment & Plan Note (Signed)
Sustained 2018.  Check ESR, Lyme disease titers.

## 2021-01-05 NOTE — Assessment & Plan Note (Signed)
Weight gain noted discussed.  Check labwork to start.

## 2021-01-05 NOTE — Assessment & Plan Note (Signed)
Update FLP off medication.  

## 2021-01-05 NOTE — Patient Instructions (Addendum)
Sign release for records from [facility].  Lab work today.  We have refilled omeprazole heartburn medicine.  Normal fasting sugars are 80-120, normal sugars after eating 100-180. <70 is too low. Let us know if any low <70.  Try to get a source of protein with every meal, eat small mid meal snacks.

## 2021-01-05 NOTE — Progress Notes (Signed)
Patient ID: Kelly Francis, female    DOB: Aug 27, 1956, 65 y.o.   MRN: [id number]  This visit was conducted in person.  BP 130/78   Pulse 86   Temp 98 F (36.7 C) (Temporal)   Ht 5' 3"  (1.6 m)   Wt 214 lb 1 oz (97.1 kg)   SpO2 98%   BMI 37.92 kg/m    CC: concern over low blood sugars  Subjective:   HPI: Kelly Francis is a 65 y.o. female presenting on 01/05/2021 for Low Blood Sugar (C/o 100 this morning, fasting. Has been "feeling bad" for over a week. ) and Nausea   Last seen 04/2017.  See recent phone note. Here today concerned over malaise weakness and fatigue over the last several months associated with sugar readings which she thought were rather low - brings log showing sugars 90-120s. She's been eating more apple sauce and even glucose tablets at night, worried they may drop.  Episode at grocery store a few weeks ago where she felt clammy, weak. Last week felt tingling sensation to right face as well as entire tough. No lip swelling or throat swelling.  Legs feel cold, notes headaches, diarrhea last week. Notes hair loss to legs. All last week felt fatigued, nauseated, weak feeling. Friday morning went home early due to nausea. She had negative home COVID test.  She has been caring for step father s/p amputation - this has been stressful period.   No fevers/chills, cough, chest pain, tightness, dyspnea, dizziness, palpitations, vomiting or constipation. No rash.   No known personal history of diabetes. +family history of DM and thyroid disease. No results found for: HGBA1C   Known back trouble - sees [place] at [facility]. Activity limited by back pain.  Had R breast papilloma removed 10/2019 ([place]).  30 lb weight gain since 2019.  Takes topiramate 36m nightly for migraines.  Endorses tick bite 2018. Did not seek care. States she had bullseye rash to arm.      Relevant past medical, surgical, family and social history reviewed and updated as indicated. Interim  medical history since our last visit reviewed. Allergies and medications reviewed and updated. Outpatient Medications Prior to Visit  Medication Sig Dispense Refill  . CVS VITAMIN B-12 500 MCG tablet TAKE 1 TABLET BY MOUTH EVERY DAY 100 tablet 3  . EPINEPHrine (EPIPEN) 0.3 mg/0.3 mL IJ SOAJ injection Inject 0.3 mLs (0.3 mg total) into the muscle once. (Patient taking differently: Inject 0.3 mg into the muscle daily as needed (allergic reaction).) 2 Device 0  . estradiol (ESTRACE) 1 MG tablet Take 1 mg by mouth daily.    .Marland KitchenFOLIC ACID PO Take by mouth daily.    .Marland Kitchenlevocetirizine (XYZAL) 5 MG tablet Take 5 mg by mouth daily.    . nitroGLYCERIN (NITROSTAT) 0.4 MG SL tablet Place 1 tablet (0.4 mg total) under the tongue every 5 (five) minutes as needed for chest pain. 25 tablet 3  . topiramate (TOPAMAX) 25 MG tablet Take 25 mg by mouth at bedtime.    .Marland Kitchenomeprazole (PRILOSEC) 40 MG capsule TAKE 1 CAPSULE EVERY DAY 30 capsule 5  . HYDROcodone-acetaminophen (NORCO/VICODIN) 5-325 MG tablet Take 1 tablet by mouth every 6 (six) hours as needed for moderate pain. 15 tablet 0   No facility-administered medications prior to visit.     Per HPI unless specifically indicated in ROS section below Review of Systems Objective:  BP 130/78   Pulse 86   Temp 98 F (36.7  C) (Temporal)   Ht 5' 3"  (1.6 m)   Wt 214 lb 1 oz (97.1 kg)   SpO2 98%   BMI 37.92 kg/m   Wt Readings from Last 3 Encounters:  01/05/21 214 lb 1 oz (97.1 kg)  11/12/19 197 lb 12 oz (89.7 kg)  06/10/18 184 lb (83.5 kg)      Physical Exam Vitals and nursing note reviewed.  Constitutional:      Appearance: Normal appearance. She is not ill-appearing.  Eyes:     Extraocular Movements: Extraocular movements intact.     Conjunctiva/sclera: Conjunctivae normal.     Pupils: Pupils are equal, round, and reactive to light.  Neck:     Thyroid: No thyroid mass or thyromegaly.  Cardiovascular:     Rate and Rhythm: Normal rate and regular  rhythm.     Pulses: Normal pulses.     Heart sounds: Normal heart sounds. No murmur heard.   Pulmonary:     Effort: Pulmonary effort is normal. No respiratory distress.     Breath sounds: Normal breath sounds. No wheezing, rhonchi or rales.  Abdominal:     General: Bowel sounds are normal. There is no distension.     Palpations: Abdomen is soft. There is no mass.     Tenderness: There is no abdominal tenderness. There is no guarding or rebound.     Hernia: No hernia is present.  Musculoskeletal:     Right lower leg: Edema (tr) present.     Left lower leg: Edema (tr) present.     Comments: 2+ DP bilaterally  Skin:    General: Skin is warm and dry.     Findings: No rash.  Neurological:     General: No focal deficit present.     Mental Status: She is alert.  Psychiatric:        Mood and Affect: Mood normal.        Behavior: Behavior normal.        Depression screen [facility] 2/9 01/05/2021  Decreased Interest (No Data)  Down, Depressed, Hopeless 1  PHQ - 2 Score 1  Altered sleeping 1  Tired, decreased energy 0  Change in appetite 0  Feeling bad or failure about yourself  0  Trouble concentrating 0  Moving slowly or fidgety/restless 0  Suicidal thoughts 0  PHQ-9 Score 2     GAD 7 : Generalized Anxiety Score 01/05/2021  Nervous, Anxious, on Edge 1  Control/stop worrying 0  Worry too much - different things 0  Trouble relaxing 0  Restless 0  Easily annoyed or irritable 0  Afraid - awful might happen 0  Total GAD 7 Score 1    Assessment & Plan:  This visit occurred during the SARS-CoV-2 public health emergency.  Safety protocols were in place, including screening questions prior to the visit, additional usage of staff PPE, and extensive cleaning of exam room while observing appropriate contact time as indicated for disinfecting solutions.   Problem List Items Addressed This Visit    Tick bite of left shoulder    Sustained 2018.  Check ESR, Lyme disease titers.        Relevant Orders   B. burgdorfi antibodies by WB   Sedimentation rate   Severe obesity (BMI 35.0-39.9) with comorbidity ([place])    Weight gain noted discussed.  Check labwork to start.       Postmenopausal hormone replacement therapy    Continues estrogen through GYN       Fatigue -  Primary    Ongoing - check labs for reversible causes.  No true hypoglycemia however sugars don't seem to be rising as expected after meal/snacks. Check labwork today.       Relevant Orders   Comprehensive metabolic panel   TSH   CBC with Differential/Platelet   Vitamin B12   VITAMIN D 25 Hydroxy (Vit-D Deficiency, Fractures)   Folate   Dyslipidemia    Update FLP off medication.       Relevant Orders   Comprehensive metabolic panel   Lipid panel       Meds ordered this encounter  Medications  . omeprazole (PRILOSEC) 40 MG capsule    Sig: Take 1 capsule (40 mg total) by mouth daily.    Dispense:  30 capsule    Refill:  6   Orders Placed This Encounter  Procedures  . Comprehensive metabolic panel  . TSH  . CBC with Differential/Platelet  . Vitamin B12  . VITAMIN D 25 Hydroxy (Vit-D Deficiency, Fractures)  . B. burgdorfi antibodies by WB  . Sedimentation rate  . Lipid panel  . Folate    Patient Instructions  Sign release for records from [facility].  Lab work today.  We have refilled omeprazole heartburn medicine.  Normal fasting sugars are 80-120, normal sugars after eating 100-180. <70 is too low. Let us know if any low <70.  Try to get a source of protein with every meal, eat small mid meal snacks.    Follow up plan: Return if symptoms worsen or fail to improve.  [name], MD

## 2021-01-05 NOTE — Assessment & Plan Note (Signed)
Continues estrogen through GYN.  

## 2021-01-06 ENCOUNTER — Telehealth: Payer: Self-pay | Admitting: Family Medicine

## 2021-01-06 MED ORDER — OMEPRAZOLE 40 MG PO CPDR: 40.0000 mg | DELAYED_RELEASE_CAPSULE | Freq: Every day | ORAL | 6 refills | Status: DC

## 2021-01-06 NOTE — Telephone Encounter (Signed)
Script sent to [address]

## 2021-01-06 NOTE — Telephone Encounter (Signed)
Prescription was sent to the wrong pharmacy it was suppose to go to [id number]

## 2021-01-07 ENCOUNTER — Other Ambulatory Visit: Payer: Self-pay | Admitting: Family Medicine

## 2021-01-07 LAB — B. BURGDORFI ANTIBODIES BY WB

## 2021-01-07 MED ORDER — VITAMIN D 50 MCG (2000 UT) PO CAPS: 1.0000 | ORAL_CAPSULE | Freq: Every day | ORAL | Status: DC

## 2021-01-12 ENCOUNTER — Ambulatory Visit: Payer: BC Managed Care – PPO | Admitting: Family Medicine

## 2021-02-02 ENCOUNTER — Encounter: Payer: Self-pay | Admitting: Family Medicine

## 2021-02-22 ENCOUNTER — Other Ambulatory Visit: Payer: Self-pay

## 2021-02-22 ENCOUNTER — Other Ambulatory Visit (INDEPENDENT_AMBULATORY_CARE_PROVIDER_SITE_OTHER): Payer: BC Managed Care – PPO

## 2021-02-22 ENCOUNTER — Other Ambulatory Visit: Payer: Self-pay | Admitting: Family Medicine

## 2021-02-22 DIAGNOSIS — E559 Vitamin D deficiency, unspecified: Secondary | ICD-10-CM | POA: Diagnosis not present

## 2021-02-22 DIAGNOSIS — N200 Calculus of kidney: Secondary | ICD-10-CM

## 2021-02-22 DIAGNOSIS — E785 Hyperlipidemia, unspecified: Secondary | ICD-10-CM

## 2021-02-22 DIAGNOSIS — Z1159 Encounter for screening for other viral diseases: Secondary | ICD-10-CM | POA: Diagnosis not present

## 2021-02-22 LAB — VITAMIN D 25 HYDROXY (VIT D DEFICIENCY, FRACTURES): VITD: 25.89 ng/mL — ABNORMAL LOW (ref 30.00–100.00)

## 2021-02-23 LAB — HEPATITIS C ANTIBODY
Hepatitis C Ab: NONREACTIVE
SIGNAL TO CUT-OFF: 0.01 (ref <1.00)

## 2021-03-01 ENCOUNTER — Encounter: Payer: Self-pay | Admitting: Family Medicine

## 2021-03-01 ENCOUNTER — Other Ambulatory Visit: Payer: Self-pay

## 2021-03-01 ENCOUNTER — Ambulatory Visit (INDEPENDENT_AMBULATORY_CARE_PROVIDER_SITE_OTHER): Payer: BC Managed Care – PPO | Admitting: Family Medicine

## 2021-03-01 VITALS — BP 122/78 | HR 94 | Temp 98.4°F | Ht 63.0 in | Wt 216.1 lb

## 2021-03-01 DIAGNOSIS — D241 Benign neoplasm of right breast: Secondary | ICD-10-CM

## 2021-03-01 DIAGNOSIS — Z79890 Hormone replacement therapy (postmenopausal): Secondary | ICD-10-CM

## 2021-03-01 DIAGNOSIS — R5383 Other fatigue: Secondary | ICD-10-CM

## 2021-03-01 DIAGNOSIS — E785 Hyperlipidemia, unspecified: Secondary | ICD-10-CM

## 2021-03-01 DIAGNOSIS — Z1211 Encounter for screening for malignant neoplasm of colon: Secondary | ICD-10-CM | POA: Diagnosis not present

## 2021-03-01 DIAGNOSIS — E559 Vitamin D deficiency, unspecified: Secondary | ICD-10-CM | POA: Diagnosis not present

## 2021-03-01 DIAGNOSIS — Z0000 Encounter for general adult medical examination without abnormal findings: Secondary | ICD-10-CM

## 2021-03-01 DIAGNOSIS — Z23 Encounter for immunization: Secondary | ICD-10-CM

## 2021-03-01 MED ORDER — VITAMIN D3 1.25 MG (50000 UT) PO TABS: 1.0000 | ORAL_TABLET | ORAL | 0 refills | Status: DC

## 2021-03-01 NOTE — Assessment & Plan Note (Signed)
Improving levels on 2000 IU daily replacement.  Will trial weekly 50k units for 3 months then reassess control, likely return to 2000 IU daily dosing.

## 2021-03-01 NOTE — Assessment & Plan Note (Signed)
S/p lumpectomy now gets yearly mammograms.

## 2021-03-01 NOTE — Assessment & Plan Note (Signed)
Continues estrogen through GYN.

## 2021-03-01 NOTE — Assessment & Plan Note (Signed)
Preventative protocols reviewed and updated unless pt declined. Discussed healthy diet and lifestyle.  

## 2021-03-01 NOTE — Progress Notes (Signed)
  Patient ID: Kelly Francis, female    DOB: 11/13/1955, 65 y.o.   MRN: [id number]  This visit was conducted in person.  BP 122/78   Pulse 94   Temp 98.4 F (36.9 C) (Temporal)   Ht 5' 3" (1.6 m)   Wt 216 lb 2 oz (98 kg)   SpO2 97%   BMI 38.28 kg/m    CC: CPE  Subjective:   HPI: Kelly Francis is a 65 y.o. female presenting on 03/01/2021 for Annual Exam   Doing well with daily vit D 2000 IU - notes it causes some insomnia.   Preventative: Colon screening - no immediate family h/o colon cancer, no blood in stool, BM changes. Requests rpt iFOB.  Well woman through OBGYN at [facility] (Dr Lynde Knowles-Jonas). S/p hysterectomy, ovaries remain.  Bre[facility] cancer screen - [facility]  Declines flu shot.  COVID vaccine pfizer 02/2020 x2 Tdap 2017 Shingrix - discussed  Advanced directive -  Seat belt use discussed.  Sunscreen use discussed. No changing moles.  Non smoker  Alcohol - rare  Dentist yearly Eye exam yearly - due Bowel - no constipation Bladder -   Lives alone.  Went through bad divorce 1 son, 30s, lives in [place]. 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     Relevant past medical, surgical, family and social history reviewed and updated as indicated. Interim medical history since our last visit reviewed. Allergies and medications reviewed and updated. Outpatient Medications Prior to Visit  Medication Sig Dispense Refill  . Cholecalciferol (VITAMIN D) 50 MCG (2000 UT) CAPS Take 1 capsule (2,000 Units total) by mouth daily. 30 capsule   . CVS VITAMIN B-12 500 MCG tablet TAKE 1 TABLET BY MOUTH EVERY DAY 100 tablet 3  . EPINEPHrine (EPIPEN) 0.3 mg/0.3 mL IJ SOAJ injection Inject 0.3 mLs (0.3 mg total) into the muscle once. (Patient taking differently: Inject 0.3 mg into the muscle daily as needed (allergic reaction).) 2 Device 0  . estradiol (ESTRACE) 1 MG tablet Take 1 mg  by mouth daily.    . FOLIC ACID PO Take by mouth daily.    . levocetirizine (XYZAL) 5 MG tablet Take 5 mg by mouth daily.    . nitroGLYCERIN (NITROSTAT) 0.4 MG SL tablet Place 1 tablet (0.4 mg total) under the tongue every 5 (five) minutes as needed for chest pain. 25 tablet 3  . omeprazole (PRILOSEC) 40 MG capsule Take 1 capsule (40 mg total) by mouth daily. 30 capsule 6  . topiramate (TOPAMAX) 25 MG tablet Take 25 mg by mouth at bedtime.     No facility-administered medications prior to visit.     Per HPI unless specifically indicated in ROS section below Review of Systems  Constitutional: Negative for activity change, appetite change, chills, fatigue, fever and unexpected weight change.  HENT: Negative for hearing loss.   Eyes: Negative for visual disturbance.  Respiratory: Negative for cough, chest tightness, shortness of breath and wheezing.   Cardiovascular: Negative for chest pain, palpitations and leg swelling.  Gastrointestinal: Negative for abdominal distention, abdominal pain, blood in stool, constipation, diarrhea, nausea and vomiting.  Genitourinary: Negative for difficulty urinating and hematuria.  Musculoskeletal: Negative for arthralgias, myalgias and neck pain.  Skin: Negative for rash.  Neurological: Positive for headaches (allergy related). Negative for dizziness, seizures and syncope.  Hematological: Negative for adenopathy. Does not bruise/bleed easily.  Psychiatric/Behavioral: Negative for dysphoric mood. The patient   is not nervous/anxious.    Objective:  BP 122/78   Pulse 94   Temp 98.4 F (36.9 C) (Temporal)   Ht 5' 3" (1.6 m)   Wt 216 lb 2 oz (98 kg)   SpO2 97%   BMI 38.28 kg/m   Wt Readings from Last 3 Encounters:  03/01/21 216 lb 2 oz (98 kg)  01/05/21 214 lb 1 oz (97.1 kg)  11/12/19 197 lb 12 oz (89.7 kg)      Physical Exam Vitals and nursing note reviewed.  Constitutional:      General: She is not in acute distress.    Appearance: Normal  appearance. She is well-developed. She is not ill-appearing.  HENT:     Head: Normocephalic and atraumatic.     Right Ear: Hearing, tympanic membrane, ear canal and external ear normal.     Left Ear: Hearing, tympanic membrane, ear canal and external ear normal.  Eyes:     General: No scleral icterus.    Extraocular Movements: Extraocular movements intact.     Conjunctiva/sclera: Conjunctivae normal.     Pupils: Pupils are equal, round, and reactive to light.  Neck:     Thyroid: No thyroid mass or thyromegaly.     Vascular: No carotid bruit.  Cardiovascular:     Rate and Rhythm: Normal rate and regular rhythm.     Pulses: Normal pulses.          Radial pulses are 2+ on the right side and 2+ on the left side.     Heart sounds: Normal heart sounds. No murmur heard.   Pulmonary:     Effort: Pulmonary effort is normal. No respiratory distress.     Breath sounds: Normal breath sounds. No wheezing, rhonchi or rales.  Abdominal:     General: Bowel sounds are normal. There is no distension.     Palpations: Abdomen is soft. There is no mass.     Tenderness: There is no abdominal tenderness. There is no guarding or rebound.     Hernia: No hernia is present.  Musculoskeletal:        General: Normal range of motion.     Cervical back: Normal range of motion and neck supple.     Right lower leg: No edema.     Left lower leg: No edema.  Lymphadenopathy:     Cervical: No cervical adenopathy.  Skin:    General: Skin is warm and dry.     Findings: No rash.  Neurological:     General: No focal deficit present.     Mental Status: She is alert and oriented to person, place, and time.     Comments: CN grossly intact, station and gait intact  Psychiatric:        Mood and Affect: Mood normal.        Behavior: Behavior normal.        Thought Content: Thought content normal.        Judgment: Judgment normal.       Results for orders placed or performed in visit on 02/22/21  Hepatitis C  antibody  Result Value Ref Range   Hepatitis C Ab NON-REACTIVE NON-REACTI   SIGNAL TO CUT-OFF 0.01 <1.00  VITAMIN D 25 Hydroxy (Vit-D Deficiency, Fractures)  Result Value Ref Range   VITD 25.89 (L) 30.00 - 100.00 ng/mL   Lab Results  Component Value Date   CHOL 221 (H) 01/05/2021   HDL 74.60 01/05/2021   LDLCALC 118 (H) 01/05/2021     LDLDIRECT 120.9 04/02/2013   TRIG 141.0 01/05/2021   CHOLHDL 3 01/05/2021    Assessment & Plan:  This visit occurred during the SARS-CoV-2 public health emergency.  Safety protocols were in place, including screening questions prior to the visit, additional usage of staff PPE, and extensive cleaning of exam room while observing appropriate contact time as indicated for disinfecting solutions.   Problem List Items Addressed This Visit    Fatigue    Overall improving with vit D replacement.       Healthcare maintenance - Primary    Preventative protocols reviewed and updated unless pt declined. Discussed healthy diet and lifestyle.       Dyslipidemia    Chol levels elevated off statin. Low ASCVD risk.  The 10-year ASCVD risk score (Goff DC Jr., et al., 2013) is: 3.7%   Values used to calculate the score:     Age: 65 years     Sex: Female     Is Non-Hispanic African American: No     Diabetic: No     Tobacco smoker: No     Systolic Blood Pressure: 122 mmHg     Is BP treated: No     HDL Cholesterol: 74.6 mg/dL     Total Cholesterol: 221 mg/dL       Postmenopausal hormone replacement therapy    Continues estrogen through GYN.       Intraductal papilloma of right breast    S/p lumpectomy now gets yearly mammograms.      Vitamin D deficiency    Improving levels on 2000 IU daily replacement.  Will trial weekly 50k units for 3 months then reassess control, likely return to 2000 IU daily dosing.       Other Visit Diagnoses    Special screening for malignant neoplasms, colon       Relevant Orders   Fecal occult blood, imunochemical    Need for shingles vaccine       Relevant Orders   Varicella-zoster vaccine IM (Completed)       Meds ordered this encounter  Medications  . Cholecalciferol (VITAMIN D3) 1.25 MG (50000 UT) TABS    Sig: Take 1 tablet by mouth once a week.    Dispense:  12 tablet    Refill:  0   Orders Placed This Encounter  Procedures  . Fecal occult blood, imunochemical    Standing Status:   Future    Standing Expiration Date:   03/01/2022  . Varicella-zoster vaccine IM    Patient instructions: Pass by lab to pick up stool kit. Shingrix vaccine today. Return in 2-6 months to complete series.   Start vitamin D 50,000 units weekly for 3 months instead of daily dose. Then may return to daily dose.  Return in 1 year for next physical. Return in 3 months for lab visit only to recheck vit D levels.   Follow up plan: Return in about 1 year (around 03/01/2022) for annual exam, prior fasting for blood work.  Javier Gutierrez, MD   

## 2021-03-01 NOTE — Patient Instructions (Addendum)
Pass by lab to pick up stool kit. Shingrix vaccine today. Return in 2-6 months to complete series.   Start vitamin D 50,000 units weekly for 3 months instead of daily dose. Then may return to daily dose.  Return in 1 year for next physical. Return in 3 months for lab visit only to recheck vit D levels.   Health Maintenance for Postmenopausal Women Menopause is a normal process in which your ability to get pregnant comes to an end. This process happens slowly over many months or years, usually between the ages of 40 and 40. Menopause is complete when you have missed your menstrual periods for 12 months. It is important to talk with your health care provider about some of the most common conditions that affect women after menopause (postmenopausal women). These include heart disease, cancer, and bone loss (osteoporosis). Adopting a healthy lifestyle and getting preventive care can help to promote your health and wellness. The actions you take can also lower your chances of developing some of these common conditions. What should I know about menopause? During menopause, you may get a number of symptoms, such as:  Hot flashes. These can be moderate or severe.  Night sweats.  Decrease in sex drive.  Mood swings.  Headaches.  Tiredness.  Irritability.  Memory problems.  Insomnia. Choosing to treat or not to treat these symptoms is a decision that you make with your health care provider. Do I need hormone replacement therapy?  Hormone replacement therapy is effective in treating symptoms that are caused by menopause, such as hot flashes and night sweats.  Hormone replacement carries certain risks, especially as you become older. If you are thinking about using estrogen or estrogen with progestin, discuss the benefits and risks with your health care provider. What is my risk for heart disease and stroke? The risk of heart disease, heart attack, and stroke increases as you age. One of the  causes may be a change in the body's hormones during menopause. This can affect how your body uses dietary fats, triglycerides, and cholesterol. Heart attack and stroke are medical emergencies. There are many things that you can do to help prevent heart disease and stroke. Watch your blood pressure  High blood pressure causes heart disease and increases the risk of stroke. This is more likely to develop in people who have high blood pressure readings, are of African descent, or are overweight.  Have your blood pressure checked: ? Every 3-5 years if you are 63-71 years of age. ? Every year if you are 44 years old or older. Eat a healthy diet  Eat a diet that includes plenty of vegetables, fruits, low-fat dairy products, and lean protein.  Do not eat a lot of foods that are high in solid fats, added sugars, or sodium.   Get regular exercise Get regular exercise. This is one of the most important things you can do for your health. Most adults should:  Try to exercise for at least 150 minutes each week. The exercise should increase your heart rate and make you sweat (moderate-intensity exercise).  Try to do strengthening exercises at least twice each week. Do these in addition to the moderate-intensity exercise.  Spend less time sitting. Even light physical activity can be beneficial. Other tips  Work with your health care provider to achieve or maintain a healthy weight.  Do not use any products that contain nicotine or tobacco, such as cigarettes, e-cigarettes, and chewing tobacco. If you need help quitting, ask  your health care provider.  Know your numbers. Ask your health care provider to check your cholesterol and your blood sugar (glucose). Continue to have your blood tested as directed by your health care provider. Do I need screening for cancer? Depending on your health history and family history, you may need to have cancer screening at different stages of your life. This may  include screening for:  Breast cancer.  Cervical cancer.  Lung cancer.  Colorectal cancer. What is my risk for osteoporosis? After menopause, you may be at increased risk for osteoporosis. Osteoporosis is a condition in which bone destruction happens more quickly than new bone creation. To help prevent osteoporosis or the bone fractures that can happen because of osteoporosis, you may take the following actions:  If you are 68-7 years old, get at least 1,000 mg of calcium and at least 600 mg of vitamin D per day.  If you are older than age 45 but younger than age 65, get at least 1,200 mg of calcium and at least 600 mg of vitamin D per day.  If you are older than age 77, get at least 1,200 mg of calcium and at least 800 mg of vitamin D per day. Smoking and drinking excessive alcohol increase the risk of osteoporosis. Eat foods that are rich in calcium and vitamin D, and do weight-bearing exercises several times each week as directed by your health care provider. How does menopause affect my mental health? Depression may occur at any age, but it is more common as you become older. Common symptoms of depression include:  Low or sad mood.  Changes in sleep patterns.  Changes in appetite or eating patterns.  Feeling an overall lack of motivation or enjoyment of activities that you previously enjoyed.  Frequent crying spells. Talk with your health care provider if you think that you are experiencing depression. General instructions See your health care provider for regular wellness exams and vaccines. This may include:  Scheduling regular health, dental, and eye exams.  Getting and maintaining your vaccines. These include: ? Influenza vaccine. Get this vaccine each year before the flu season begins. ? Pneumonia vaccine. ? Shingles vaccine. ? Tetanus, diphtheria, and pertussis (Tdap) booster vaccine. Your health care provider may also recommend other immunizations. Tell your  health care provider if you have ever been abused or do not feel safe at home. Summary  Menopause is a normal process in which your ability to get pregnant comes to an end.  This condition causes hot flashes, night sweats, decreased interest in sex, mood swings, headaches, or lack of sleep.  Treatment for this condition may include hormone replacement therapy.  Take actions to keep yourself healthy, including exercising regularly, eating a healthy diet, watching your weight, and checking your blood pressure and blood sugar levels.  Get screened for cancer and depression. Make sure that you are up to date with all your vaccines. This information is not intended to replace advice given to you by your health care provider. Make sure you discuss any questions you have with your health care provider. Document Revised: 10/23/2018 Document Reviewed: 10/23/2018 Elsevier Patient Education  2021 [employer].

## 2021-03-01 NOTE — Assessment & Plan Note (Signed)
Overall improving with vit D replacement.

## 2021-03-01 NOTE — Assessment & Plan Note (Addendum)
Chol levels elevated off statin. Low ASCVD risk.  The 10-year ASCVD risk score Mikey Bussing DC Brooke Bonito., et al., 2013) is: 3.7%   Values used to calculate the score:     Age: 65 years     Sex: Female     Is Non-Hispanic African American: No     Diabetic: No     Tobacco smoker: No     Systolic Blood Pressure: 948 mmHg     Is BP treated: No     HDL Cholesterol: 74.6 mg/dL     Total Cholesterol: 221 mg/dL

## 2021-03-16 ENCOUNTER — Other Ambulatory Visit (INDEPENDENT_AMBULATORY_CARE_PROVIDER_SITE_OTHER): Payer: BC Managed Care – PPO

## 2021-03-16 DIAGNOSIS — Z1211 Encounter for screening for malignant neoplasm of colon: Secondary | ICD-10-CM

## 2021-03-16 LAB — FECAL OCCULT BLOOD, IMMUNOCHEMICAL: Fecal Occult Bld: NEGATIVE

## 2021-03-16 LAB — FECAL OCCULT BLOOD, GUAIAC: Fecal Occult Blood: NEGATIVE

## 2021-03-21 ENCOUNTER — Encounter: Payer: Self-pay | Admitting: Family Medicine

## 2021-05-03 DIAGNOSIS — G43719 Chronic migraine without aura, intractable, without status migrainosus: Secondary | ICD-10-CM | POA: Diagnosis not present

## 2021-05-03 DIAGNOSIS — G43019 Migraine without aura, intractable, without status migrainosus: Secondary | ICD-10-CM | POA: Diagnosis not present

## 2021-05-17 ENCOUNTER — Other Ambulatory Visit: Payer: Self-pay | Admitting: Family Medicine

## 2021-05-18 ENCOUNTER — Encounter: Payer: Self-pay | Admitting: Family Medicine

## 2021-05-18 DIAGNOSIS — E559 Vitamin D deficiency, unspecified: Secondary | ICD-10-CM

## 2021-05-29 ENCOUNTER — Other Ambulatory Visit: Payer: Self-pay | Admitting: Family Medicine

## 2021-05-29 DIAGNOSIS — E559 Vitamin D deficiency, unspecified: Secondary | ICD-10-CM

## 2021-05-31 ENCOUNTER — Ambulatory Visit (INDEPENDENT_AMBULATORY_CARE_PROVIDER_SITE_OTHER): Payer: BC Managed Care – PPO

## 2021-05-31 ENCOUNTER — Other Ambulatory Visit: Payer: Self-pay

## 2021-05-31 ENCOUNTER — Other Ambulatory Visit (INDEPENDENT_AMBULATORY_CARE_PROVIDER_SITE_OTHER): Payer: BC Managed Care – PPO

## 2021-05-31 DIAGNOSIS — E559 Vitamin D deficiency, unspecified: Secondary | ICD-10-CM | POA: Diagnosis not present

## 2021-05-31 DIAGNOSIS — Z23 Encounter for immunization: Secondary | ICD-10-CM

## 2021-06-01 LAB — VITAMIN D 25 HYDROXY (VIT D DEFICIENCY, FRACTURES): VITD: 39.24 ng/mL (ref 30.00–100.00)

## 2021-07-19 ENCOUNTER — Other Ambulatory Visit: Payer: Self-pay

## 2021-07-19 ENCOUNTER — Encounter: Payer: Self-pay | Admitting: Family Medicine

## 2021-07-19 ENCOUNTER — Ambulatory Visit: Payer: BC Managed Care – PPO | Admitting: Family Medicine

## 2021-07-19 DIAGNOSIS — Z79890 Hormone replacement therapy (postmenopausal): Secondary | ICD-10-CM | POA: Diagnosis not present

## 2021-07-19 DIAGNOSIS — G43009 Migraine without aura, not intractable, without status migrainosus: Secondary | ICD-10-CM

## 2021-07-19 DIAGNOSIS — M5136 Other intervertebral disc degeneration, lumbar region: Secondary | ICD-10-CM | POA: Diagnosis not present

## 2021-07-19 DIAGNOSIS — E559 Vitamin D deficiency, unspecified: Secondary | ICD-10-CM

## 2021-07-19 MED ORDER — NALTREXONE-BUPROPION HCL ER 8-90 MG PO TB12: ORAL_TABLET | ORAL | 0 refills | Status: DC

## 2021-07-19 NOTE — Patient Instructions (Addendum)
Try contrave - start 1 tablet daily for 1 week then increase to twice daily for 1 week then 2 in the morning and 1 at night for a week then full dose 2 pills twice daily.  Consider nutritionist evaluation.  Return in 1 month for follow up visit.

## 2021-07-19 NOTE — Assessment & Plan Note (Signed)
Did not tolerate 50k unit dose - continues 2000 IU daily.

## 2021-07-19 NOTE — Progress Notes (Signed)
Patient ID: Kelly Francis, female    DOB: [birth year], 65 y.o.   MRN: [id number]  This visit was conducted in person.  BP 124/78   Pulse 85   Temp 98.8 F (37.1 C) (Temporal)   Ht '5\' 3"'$  (1.6 m)   Wt 218 lb 2 oz (98.9 kg)   SpO2 97%   BMI 38.64 kg/m    CC: discuss weight loss medication Subjective:   HPI: Kelly Francis is a 65 y.o. female presenting on 07/19/2021 for Weight Management (Wants to discuss med to lose wt.  States she was successful on phentermine in the past. )   Starting weight: 218 lbs Last weight: 218 lbs Today's weight 218 lbs  Previously belviq unaffordable, did not try contrave.  Previously phentermine effective.   Sedentary lifestyle. Stressful desk job Emergency planning/management officer).  On topamax '25mg'$  nightly for migraine prevention.   24 hour recall: 9am scrambled egg sandwich, unsweet tea 4pm [employer] chicken salad (lettuce, tomatoes, cucumbers, cranberries) in taco shell with 2 vinagrette "house" dressings, unsweet tea  No snacks in between  Has not seen nutritionist   Activity regimen: No regular exercise - activity limited by chronic lower back pain. Has seen ortho Lynann Bologna, now [place]) - lumbar facet arthropathy and R lumbar radiculitis (L3/4 distribution).   H/o chest pain s/p reassuring cardiac evaluation, hypertensive response to exercise during ETT (2015). Last saw cardiology 2018 for precordial chest pain, declined stress myoview at that time. Does have significant fmhx CAD - grandfather, uncle (paternal).   She is on estrogen through OBGYN for postmenopausal syndrome.   Had trouble tolerating vitamin D 50,000 units weekly - she switched back to 2000 IU daily which is better tolerated.   Notes increased nocturia.      Relevant past medical, surgical, family and social history reviewed and updated as indicated. Interim medical history since our last visit reviewed. Allergies and medications reviewed and updated. Outpatient Medications Prior to Visit   Medication Sig Dispense Refill   Cholecalciferol (VITAMIN D) 50 MCG (2000 UT) CAPS Take 1 capsule (2,000 Units total) by mouth daily. 30 capsule    [facility] VITAMIN B-12 500 MCG tablet TAKE 1 TABLET BY MOUTH EVERY DAY 100 tablet 3   EPINEPHrine (EPIPEN) 0.3 mg/0.3 mL IJ SOAJ injection Inject 0.3 mLs (0.3 mg total) into the muscle once. (Patient taking differently: Inject 0.3 mg into the muscle daily as needed (allergic reaction).) 2 Device 0   estradiol (ESTRACE) 1 MG tablet Take 1 mg by mouth daily.     FOLIC ACID PO Take by mouth daily.     levocetirizine (XYZAL) 5 MG tablet Take 5 mg by mouth daily.     nitroGLYCERIN (NITROSTAT) 0.4 MG SL tablet Place 1 tablet (0.4 mg total) under the tongue every 5 (five) minutes as needed for chest pain. 25 tablet 3   omeprazole (PRILOSEC) 40 MG capsule Take 1 capsule (40 mg total) by mouth daily. 30 capsule 6   topiramate (TOPAMAX) 25 MG tablet Take 25 mg by mouth at bedtime.     No facility-administered medications prior to visit.     Per HPI unless specifically indicated in ROS section below Review of Systems  Objective:  BP 124/78   Pulse 85   Temp 98.8 F (37.1 C) (Temporal)   Ht '5\' 3"'$  (1.6 m)   Wt 218 lb 2 oz (98.9 kg)   SpO2 97%   BMI 38.64 kg/m   Wt Readings from Last 3 Encounters:  07/19/21 218 lb  2 oz (98.9 kg)  03/01/21 216 lb 2 oz (98 kg)  01/05/21 214 lb 1 oz (97.1 kg)      Physical Exam Vitals and nursing note reviewed.  Constitutional:      Appearance: Normal appearance. She is obese. She is not ill-appearing.  Neck:     Thyroid: No thyroid mass or thyromegaly.  Cardiovascular:     Rate and Rhythm: Normal rate and regular rhythm.     Pulses: Normal pulses.     Heart sounds: Normal heart sounds. No murmur heard. Pulmonary:     Effort: Pulmonary effort is normal. No respiratory distress.     Breath sounds: Normal breath sounds. No wheezing, rhonchi or rales.  Musculoskeletal:     Right lower leg: No edema.     Left lower  leg: No edema.  Neurological:     Mental Status: She is alert.  Psychiatric:        Mood and Affect: Mood normal.        Behavior: Behavior normal.      Results for orders placed or performed in visit on 05/31/21  VITAMIN D 25 Hydroxy (Vit-D Deficiency, Fractures)  Result Value Ref Range   VITD 39.24 30.00 - 100.00 ng/mL    Assessment & Plan:  This visit occurred during the SARS-CoV-2 public health emergency.  Safety protocols were in place, including screening questions prior to the visit, additional usage of staff PPE, and extensive cleaning of exam room while observing appropriate contact time as indicated for disinfecting solutions.   Problem List Items Addressed This Visit     Common migraine    Stable period on nightly topamax '25mg'$  - difficulty tolerating higher dose due to excess sedation.      Severe obesity (BMI 35.0-39.9) with comorbidity ([place]) - Primary    Reviewed 24 hour food diary as well as exercise routine - limited due to chronic back pain.  Discussed bariatric medication options -she had initial good effect with phentermine but less effective on repeat use.  Discussed saxenda mechanism of action as well as side effects to watch for - she is hesitant to take any medication with GI side effects and worried about affordability.  Discussed contrave mechanism of action as well as side effects to watch for - will trial this, reviewed titration schedule.  RTC 4-6 wks f/u visit - consider virtual visit if she has scale/BP readings available.       Relevant Medications   Naltrexone-buPROPion HCl ER 8-90 MG TB12   Postmenopausal hormone replacement therapy   DDD (degenerative disc disease), lumbar   Vitamin D deficiency    Did not tolerate 50k unit dose - continues 2000 IU daily.         Meds ordered this encounter  Medications   Naltrexone-buPROPion HCl ER 8-90 MG TB12    Sig: Start 1 tablet every morning for 7 days, then 1 tablet twice daily for 7 days, then 2  tablets every morning and one in the evening    Dispense:  120 tablet    Refill:  0   No orders of the defined types were placed in this encounter.    Patient Instructions  Try contrave - start 1 tablet daily for 1 week then increase to twice daily for 1 week then 2 in the morning and 1 at night for a week then full dose 2 pills twice daily.  Consider nutritionist evaluation.  Return in 1 month for follow up visit.   Follow  up plan: Return in about 4 weeks (around 08/16/2021) for follow up visit.  [name], MD

## 2021-07-19 NOTE — Assessment & Plan Note (Addendum)
Reviewed 24 hour food diary as well as exercise routine - limited due to chronic back pain.  Discussed bariatric medication options -she had initial good effect with phentermine but less effective on repeat use.  Discussed saxenda mechanism of action as well as side effects to watch for - she is hesitant to take any medication with GI side effects and worried about affordability.  Discussed contrave mechanism of action as well as side effects to watch for - will trial this, reviewed titration schedule.  RTC 4-6 wks f/u visit - consider virtual visit if she has scale/BP readings available.

## 2021-07-19 NOTE — Assessment & Plan Note (Signed)
Stable period on nightly topamax '25mg'$  - difficulty tolerating higher dose due to excess sedation.

## 2021-07-20 ENCOUNTER — Encounter: Payer: Self-pay | Admitting: Family Medicine

## 2021-07-21 NOTE — Telephone Encounter (Signed)
Patient called in stated she needs a approval to filled prescription for  contrave  under her insurance . Please advise

## 2021-08-02 ENCOUNTER — Encounter: Payer: Self-pay | Admitting: Family Medicine

## 2021-08-02 NOTE — Telephone Encounter (Signed)
[facility] called regarding pa for pt. Tdhe call bk number is [phone]

## 2021-08-02 NOTE — Telephone Encounter (Signed)
FYI to Dr. Darnell Level and Leafy Ro.  (See Pt Msg, 07/20/21)

## 2021-08-02 NOTE — Telephone Encounter (Addendum)
Returned call to [employer] concerning PA.  Spoke with Kelly Francis, says he does not see anything concerning a PA or that anyone tried to call us today concerning Contrave.  Will try to touch base tomorrow.

## 2021-08-03 ENCOUNTER — Encounter: Payer: Self-pay | Admitting: Family Medicine

## 2021-08-03 NOTE — Telephone Encounter (Signed)
Re-submitted PA for naltrexone-bupropion ER (Contrave) 8-90 mg tab; key: [id number].  Decision pending.    If no response in a responsible timeframe, contact [facility] [employer] directly at [phone].

## 2021-08-03 NOTE — Telephone Encounter (Signed)
Spoke with Maudie Mercury at [facility] about [place].  States she sees they received it and are it is being processed.  They will fax [place] a response.   Updated pt via MyChart.

## 2021-08-04 NOTE — Telephone Encounter (Signed)
Received faxed PA form requesting additional info.  Placed form in Dr. Synthia Innocent box.

## 2021-08-05 NOTE — Telephone Encounter (Signed)
Faxed PA form.  Decision pending.

## 2021-08-05 NOTE — Telephone Encounter (Signed)
Form signed and in [name]'s box.  

## 2021-08-10 NOTE — Telephone Encounter (Signed)
Checked on PA status on CoverMyMeds.  Shows denial.  Reason:  This health benefit plan does not cover the following services, supplies, drugs or charges: Any treatment or regimen, medical or surgical, for the purpose of reducing or controlling the weight of the member, or for the treatment of obesity, except for surgical treatment of morbid obesity, or as specifically covered by this health benefit plan.  Spoke with Yves Dill of [employer] at [phone] notifying them of above and that we have not received the faxed denial.  She says it shows the fax was sent on 08/05/21.  Says she will fax again now.  In the meantime, I will notify Dr. Darnell Level and pt of denial.

## 2021-08-19 ENCOUNTER — Other Ambulatory Visit: Payer: Self-pay | Admitting: Obstetrics and Gynecology

## 2021-08-19 DIAGNOSIS — Z1231 Encounter for screening mammogram for malignant neoplasm of breast: Secondary | ICD-10-CM

## 2021-08-26 MED ORDER — PHENTERMINE HCL 37.5 MG PO CAPS: 37.5000 mg | ORAL_CAPSULE | ORAL | 0 refills | Status: DC

## 2021-08-26 NOTE — Telephone Encounter (Signed)
Received faxed PA denial.  Reason:  Health benefit plan does not cover the following services, supplies, drugs or charges:  Any tx or regimen, medical or surgical, for the purpose of rducing or controlling the weight  of the member, or for the tx of obesity, except for surgical tx of morbid obesity, or as specifically covered by this health benefit plan. FYI to Dr. Darnell Level.

## 2021-08-26 NOTE — Addendum Note (Signed)
Addended by: [name] on: 08/26/2021 08:28 AM   Modules accepted: Orders

## 2021-08-26 NOTE — Addendum Note (Signed)
Addended by: [name] on: 08/26/2021 01:54 PM   Modules accepted: Orders

## 2021-08-27 NOTE — Telephone Encounter (Signed)
Noted. Pt aware 

## 2021-09-29 ENCOUNTER — Ambulatory Visit
Admission: RE | Admit: 2021-09-29 | Discharge: 2021-09-29 | Disposition: A | Discharge status: Home / Self Care | Payer: BC Managed Care – PPO | Source: Ambulatory Visit | Attending: Obstetrics and Gynecology | Admitting: Obstetrics and Gynecology

## 2021-09-29 ENCOUNTER — Other Ambulatory Visit: Payer: Self-pay

## 2021-09-29 DIAGNOSIS — Z1231 Encounter for screening mammogram for malignant neoplasm of breast: Secondary | ICD-10-CM

## 2021-10-24 DIAGNOSIS — G43719 Chronic migraine without aura, intractable, without status migrainosus: Secondary | ICD-10-CM | POA: Diagnosis not present

## 2021-10-24 DIAGNOSIS — G43019 Migraine without aura, intractable, without status migrainosus: Secondary | ICD-10-CM | POA: Diagnosis not present

## 2021-12-28 LAB — CBC AND DIFFERENTIAL
Hemoglobin: 14.1 (ref 12.0–16.0)
Platelets: 223 10*3/uL (ref 150–400)
WBC: 6.5

## 2021-12-28 LAB — BASIC METABOLIC PANEL
Creatinine: 0.6 (ref 0.5–1.1)
Glucose: 96
Sodium: 140 (ref 137–147)

## 2021-12-28 LAB — LIPID PANEL
Cholesterol: 225 — AB (ref 0–200)
HDL: 64 (ref 35–70)
LDL Cholesterol: 125
Triglycerides: 204 — AB (ref 40–160)

## 2021-12-28 LAB — HEMOGLOBIN A1C: Hemoglobin A1C: 5.5

## 2021-12-28 LAB — HEPATIC FUNCTION PANEL
ALT: 26 U/L (ref 7–35)
Alkaline Phosphatase: 121 (ref 25–125)
Bilirubin, Total: 0.2

## 2022-03-01 ENCOUNTER — Other Ambulatory Visit: Payer: Self-pay | Admitting: Family Medicine

## 2022-03-01 DIAGNOSIS — N951 Menopausal and female climacteric states: Secondary | ICD-10-CM

## 2022-03-01 DIAGNOSIS — E785 Hyperlipidemia, unspecified: Secondary | ICD-10-CM

## 2022-03-01 DIAGNOSIS — E559 Vitamin D deficiency, unspecified: Secondary | ICD-10-CM

## 2022-03-02 ENCOUNTER — Other Ambulatory Visit: Payer: BC Managed Care – PPO

## 2022-03-06 ENCOUNTER — Ambulatory Visit (INDEPENDENT_AMBULATORY_CARE_PROVIDER_SITE_OTHER): Payer: BC Managed Care – PPO | Admitting: Family Medicine

## 2022-03-06 ENCOUNTER — Encounter: Payer: Self-pay | Admitting: Family Medicine

## 2022-03-06 VITALS — BP 124/78 | HR 82 | Temp 97.7°F | Ht 62.0 in | Wt 216.1 lb

## 2022-03-06 DIAGNOSIS — Z0001 Encounter for general adult medical examination with abnormal findings: Secondary | ICD-10-CM | POA: Diagnosis not present

## 2022-03-06 DIAGNOSIS — Z0000 Encounter for general adult medical examination without abnormal findings: Secondary | ICD-10-CM

## 2022-03-06 DIAGNOSIS — N3946 Mixed incontinence: Secondary | ICD-10-CM

## 2022-03-06 DIAGNOSIS — Z7189 Other specified counseling: Secondary | ICD-10-CM | POA: Insufficient documentation

## 2022-03-06 DIAGNOSIS — E559 Vitamin D deficiency, unspecified: Secondary | ICD-10-CM

## 2022-03-06 DIAGNOSIS — E785 Hyperlipidemia, unspecified: Secondary | ICD-10-CM | POA: Diagnosis not present

## 2022-03-06 DIAGNOSIS — R5383 Other fatigue: Secondary | ICD-10-CM

## 2022-03-06 DIAGNOSIS — R350 Frequency of micturition: Secondary | ICD-10-CM | POA: Diagnosis not present

## 2022-03-06 DIAGNOSIS — H409 Unspecified glaucoma: Secondary | ICD-10-CM

## 2022-03-06 DIAGNOSIS — Z1211 Encounter for screening for malignant neoplasm of colon: Secondary | ICD-10-CM | POA: Diagnosis not present

## 2022-03-06 DIAGNOSIS — L989 Disorder of the skin and subcutaneous tissue, unspecified: Secondary | ICD-10-CM | POA: Insufficient documentation

## 2022-03-06 DIAGNOSIS — G43009 Migraine without aura, not intractable, without status migrainosus: Secondary | ICD-10-CM

## 2022-03-06 LAB — POC URINALSYSI DIPSTICK (AUTOMATED)
Glucose, UA: NEGATIVE
Nitrite, UA: NEGATIVE
Protein, UA: POSITIVE — AB
Spec Grav, UA: >=1.03 — AB (ref 1.010–1.025)
Urobilinogen, UA: 0.2 E.U./dL
pH, UA: 5.5 (ref 5.0–8.0)

## 2022-03-06 MED ORDER — LOVASTATIN 20 MG PO TABS: 20.0000 mg | ORAL_TABLET | Freq: Every day | ORAL | 3 refills | Status: DC

## 2022-03-06 MED ORDER — DOXYCYCLINE HYCLATE 100 MG PO TABS: 100.0000 mg | ORAL_TABLET | Freq: Two times a day (BID) | ORAL | 0 refills | Status: DC

## 2022-03-06 MED ORDER — OMEPRAZOLE 40 MG PO CPDR: 40.0000 mg | DELAYED_RELEASE_CAPSULE | Freq: Every day | ORAL | 3 refills | Status: AC | PRN

## 2022-03-06 NOTE — Assessment & Plan Note (Signed)
Encouraged healthy diet to affect sustainable weight loss. Activity limited by back pain.  ?

## 2022-03-06 NOTE — Assessment & Plan Note (Signed)
Encouraged she schedule f/u as due . ?

## 2022-03-06 NOTE — Patient Instructions (Addendum)
Urine test today.  ?Pass by lab to pick up stool kit.  ?Schedule eye exam.  ?Start cholesterol medicine sent to pharmacy.  ?Advanced directive packet provided today.  ?Take doxycycline antibiotic 5 day course for spot on leg. Take with food. Let us know if not improving as expected.  ?Let me know if interested in urology evaluation.  ?Return as needed or in 1 year for next physical.  ? ?Health Maintenance for Postmenopausal Women ?Menopause is a normal process in which your ability to get pregnant comes to an end. This process happens slowly over many months or years, usually between the ages of 30 and 66. Menopause is complete when you have missed your menstrual period for 12 months. ?It is important to talk with your health care provider about some of the most common conditions that affect women after menopause (postmenopausal women). These include heart disease, cancer, and bone loss (osteoporosis). Adopting a healthy lifestyle and getting preventive care can help to promote your health and wellness. The actions you take can also lower your chances of developing some of these common conditions. ?What are the signs and symptoms of menopause? ?During menopause, you may have the following symptoms: ?Hot flashes. These can be moderate or severe. ?Night sweats. ?Decrease in sex drive. ?Mood swings. ?Headaches. ?Tiredness (fatigue). ?Irritability. ?Memory problems. ?Problems falling asleep or staying asleep. ?Talk with your health care provider about treatment options for your symptoms. ?Do I need hormone replacement therapy? ?Hormone replacement therapy is effective in treating symptoms that are caused by menopause, such as hot flashes and night sweats. ?Hormone replacement carries certain risks, especially as you become older. If you are thinking about using estrogen or estrogen with progestin, discuss the benefits and risks with your health care provider. ?How can I reduce my risk for heart disease and stroke? ?The  risk of heart disease, heart attack, and stroke increases as you age. One of the causes may be a change in the body's hormones during menopause. This can affect how your body uses dietary fats, triglycerides, and cholesterol. Heart attack and stroke are medical emergencies. There are many things that you can do to help prevent heart disease and stroke. ?Watch your blood pressure ?High blood pressure causes heart disease and increases the risk of stroke. This is more likely to develop in people who have high blood pressure readings or are overweight. ?Have your blood pressure checked: ?Every 3-5 years if you are 43-40 years of age. ?Every year if you are 64 years old or older. ?Eat a healthy diet ? ?Eat a diet that includes plenty of vegetables, fruits, low-fat dairy products, and lean protein. ?Do not eat a lot of foods that are high in solid fats, added sugars, or sodium. ?Get regular exercise ?Get regular exercise. This is one of the most important things you can do for your health. Most adults should: ?Try to exercise for at least 150 minutes each week. The exercise should increase your heart rate and make you sweat (moderate-intensity exercise). ?Try to do strengthening exercises at least twice each week. Do these in addition to the moderate-intensity exercise. ?Spend less time sitting. Even light physical activity can be beneficial. ?Other tips ?Work with your health care provider to achieve or maintain a healthy weight. ?Do not use any products that contain nicotine or tobacco. These products include cigarettes, chewing tobacco, and vaping devices, such as e-cigarettes. If you need help quitting, ask your health care provider. ?Know your numbers. Ask your health care provider  to check your cholesterol and your blood sugar (glucose). Continue to have your blood tested as directed by your health care provider. ?Do I need screening for cancer? ?Depending on your health history and family history, you may need to  have cancer screenings at different stages of your life. This may include screening for: ?Breast cancer. ?Cervical cancer. ?Lung cancer. ?Colorectal cancer. ?What is my risk for osteoporosis? ?After menopause, you may be at increased risk for osteoporosis. Osteoporosis is a condition in which bone destruction happens more quickly than new bone creation. To help prevent osteoporosis or the bone fractures that can happen because of osteoporosis, you may take the following actions: ?If you are 24-8 years old, get at least 1,000 mg of calcium and at least 600 international units (IU) of vitamin D per day. ?If you are older than age 28 but younger than age 99, get at least 1,200 mg of calcium and at least 600 international units (IU) of vitamin D per day. ?If you are older than age 79, get at least 1,200 mg of calcium and at least 800 international units (IU) of vitamin D per day. ?Smoking and drinking excessive alcohol increase the risk of osteoporosis. Eat foods that are rich in calcium and vitamin D, and do weight-bearing exercises several times each week as directed by your health care provider. ?How does menopause affect my mental health? ?Depression may occur at any age, but it is more common as you become older. Common symptoms of depression include: ?Feeling depressed. ?Changes in sleep patterns. ?Changes in appetite or eating patterns. ?Feeling an overall lack of motivation or enjoyment of activities that you previously enjoyed. ?Frequent crying spells. ?Talk with your health care provider if you think that you are experiencing any of these symptoms. ?General instructions ?See your health care provider for regular wellness exams and vaccines. This may include: ?Scheduling regular health, dental, and eye exams. ?Getting and maintaining your vaccines. These include: ?Influenza vaccine. Get this vaccine each year before the flu season begins. ?Pneumonia vaccine. ?Shingles vaccine. ?Tetanus, diphtheria, and  pertussis (Tdap) booster vaccine. ?Your health care provider may also recommend other immunizations. ?Tell your health care provider if you have ever been abused or do not feel safe at home. ?Summary ?Menopause is a normal process in which your ability to get pregnant comes to an end. ?This condition causes hot flashes, night sweats, decreased interest in sex, mood swings, headaches, or lack of sleep. ?Treatment for this condition may include hormone replacement therapy. ?Take actions to keep yourself healthy, including exercising regularly, eating a healthy diet, watching your weight, and checking your blood pressure and blood sugar levels. ?Get screened for cancer and depression. Make sure that you are up to date with all your vaccines. ?This information is not intended to replace advice given to you by your health care provider. Make sure you discuss any questions you have with your health care provider. ?Document Revised: 03/21/2021 Document Reviewed: 03/21/2021 ?Elsevier Patient Education ? [place]. ? ?

## 2022-03-06 NOTE — Progress Notes (Signed)
? ? Patient ID: Kelly Francis, female    DOB: 01/29/1956, 66 y.o.   MRN: [id number] ? ?This visit was conducted in person. ? ?BP 124/78   Pulse 82   Temp 97.7 ?F (36.5 ?C) (Temporal)   Ht '5\' 2"'$  (1.575 m)   Wt 216 lb 2 oz (98 kg)   SpO2 98%   BMI 39.53 kg/m?   ? ?CC: CPE ?Subjective:  ? ?HPI: ?Kelly Francis is a 66 y.o. female presenting on 03/06/2022 for Annual Exam, Insect Bite (C/o insect bite on lower L leg.  Noticed about 3 wks ago.  Area is red but is improving. ), and Urinary Frequency (C/o urinary frequency for past 6-8 mos. ) ? ? ?Continues vitamin D3 2000 IU daily and vitamin B12 519mg daily.  ?Urinary frequency - ongoing for 6-8 months. Both nocturia and daytime frequency. She is sh/o hysterectomy age 66yo Severe urge incontinence, also has stress incontinence symptoms. No dysuria or blood in urine. She has h/o "double kidney".  ? ?Insect bite to L lower leg sustained about 3 weeks ago - slow recovery. Scab in center with surrounding erythema. She's been using triple abx cream.  ? ?Her health insurance doesn't cover weight loss medication. We have previously used phentermine courses, latest 08/2021 - she did not tolerate this well. ? ?Preventative: ?Colon screening - yearly iFOB normal.  ?[facility] L[facility]. S/p hysterectomy, ovaries remain.  ?[facility] - [facility]  ?DEXA -  ?Lung cancer screen - not eligible  ?Declines flu shot.  ?COVID vaccine pfizer 02/2020 x2, no booster ?Tdap 2017 ?Shingrix - 02/2021, 05/2021 ?Advanced directive - does not have this set up yet. Would want brother and SIL to be HCPOA (Francee Piccoloand TOlivia Mackie. Packet provided today.  ?Seat belt use discussed.   ?Sunscreen use discussed. No changing moles.  ?Non smoker  ?Alcohol - rare  ?Dentist yearly ?Eye exam - due ?Bowel - no constipation ?Bladder - + incontinence - see above ?  ?Lives alone.  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   ? ?Relevant past medical, surgical, family and social history reviewed and updated as indicated. Interim medical history since our last visit reviewed. ?Allergies and medications reviewed and updated. ?Outpatient Medications Prior to Visit  ?Medication Sig Dispense Refill  ?? Cholecalciferol (VITAMIN D) 50 MCG (2000 UT) CAPS Take 1 capsule (2,000 Units total) by mouth daily. 30 capsule   ?? CVS VITAMIN B-12 500 MCG tablet TAKE 1 TABLET BY MOUTH EVERY DAY 100 tablet 3  ?? EPINEPHrine (EPIPEN) 0.3 mg/0.3 mL IJ SOAJ injection Inject 0.3 mLs (0.3 mg total) into the muscle once. (Patient taking differently: Inject 0.3 mg into the muscle daily as needed (allergic reaction).) 2 Device 0  ?? estradiol (ESTRACE) 1 MG tablet Take 1 mg by mouth daily.    ?? FOLIC ACID PO Take by mouth daily.    ?? levocetirizine (XYZAL) 5 MG tablet Take 5 mg by mouth daily.    ?? nitroGLYCERIN (NITROSTAT) 0.4 MG SL tablet Place 1 tablet (0.4 mg total) under the tongue every 5 (five) minutes as needed for chest pain. 25 tablet 3  ?? topiramate (TOPAMAX) 25 MG tablet Take 25 mg by mouth at bedtime.    ?? omeprazole (PRILOSEC) 40 MG capsule Take 1 capsule (40 mg total) by  mouth daily. 30 capsule 6  ?? phentermine 37.5 MG capsule Take 1 capsule (37.5 mg total) by mouth every morning. 30 capsule 0  ? ?No facility-administered medications prior to visit.  ?  ? ?Per HPI unless specifically indicated in ROS section below ?Review of Systems  ?Constitutional:  Negative for activity change, appetite change, chills, fatigue, fever and unexpected weight change.  ?HENT:  Negative for hearing loss.   ?Eyes:  Negative for visual disturbance.  ?Respiratory:  Positive for shortness of breath. Negative for cough, chest tightness and wheezing.   ?Cardiovascular:  Negative for chest pain, palpitations and leg swelling.  ?Gastrointestinal:  Negative  for abdominal distention, abdominal pain, blood in stool, constipation, diarrhea, nausea and vomiting.  ?Genitourinary:  Negative for difficulty urinating and hematuria.  ?Musculoskeletal:  Negative for arthralgias, myalgias and neck pain.  ?Skin:  Negative for rash.  ?Neurological:  Positive for headaches (allergy related). Negative for dizziness, seizures and syncope.  ?Hematological:  Negative for adenopathy. Does not bruise/bleed easily.  ?Psychiatric/Behavioral:  Negative for dysphoric mood. The patient is not nervous/anxious.   ? ?Objective:  ?BP 124/78   Pulse 82   Temp 97.7 ?F (36.5 ?C) (Temporal)   Ht '5\' 2"'$  (1.575 m)   Wt 216 lb 2 oz (98 kg)   SpO2 98%   BMI 39.53 kg/m?   ?Wt Readings from Last 3 Encounters:  ?03/06/22 216 lb 2 oz (98 kg)  ?07/19/21 218 lb 2 oz (98.9 kg)  ?03/01/21 216 lb 2 oz (98 kg)  ?  ?  ?Physical Exam ?Vitals and nursing note reviewed.  ?Constitutional:   ?   Appearance: Normal appearance. She is not ill-appearing.  ?HENT:  ?   Head: Normocephalic and atraumatic.  ?   Right Ear: Tympanic membrane, ear canal and external ear normal. There is no impacted cerumen.  ?   Left Ear: Tympanic membrane, ear canal and external ear normal. There is no impacted cerumen.  ?   Mouth/Throat:  ?   Mouth: Mucous membranes are moist.  ?   Pharynx: Oropharynx is clear. No oropharyngeal exudate or posterior oropharyngeal erythema.  ?Eyes:  ?   General:     ?   Right eye: No discharge.     ?   Left eye: No discharge.  ?   Extraocular Movements: Extraocular movements intact.  ?   Conjunctiva/sclera: Conjunctivae normal.  ?   Pupils: Pupils are equal, round, and reactive to light.  ?Neck:  ?   Thyroid: No thyroid mass or thyromegaly.  ?   Vascular: No carotid bruit.  ?Cardiovascular:  ?   Rate and Rhythm: Normal rate and regular rhythm.  ?   Pulses: Normal pulses.  ?   Heart sounds: Normal heart sounds. No murmur heard. ?Pulmonary:  ?   Effort: Pulmonary effort is normal. No respiratory distress.  ?    Breath sounds: Normal breath sounds. No wheezing, rhonchi or rales.  ?Abdominal:  ?   General: Bowel sounds are normal. There is no distension.  ?   Palpations: Abdomen is soft. There is no mass.  ?   Tenderness: There is no abdominal tenderness. There is no guarding or rebound.  ?   Hernia: No hernia is present.  ?Musculoskeletal:  ?   Cervical back: Normal range of motion and neck supple. No rigidity.  ?   Right lower leg: No edema.  ?   Left lower leg: No edema.  ?Lymphadenopathy:  ?   Cervical: No cervical adenopathy.  ?  Skin: ?   General: Skin is warm and dry.  ?   Findings: Lesion present. No rash.  ?   Comments: Left medial lower leg with scab with surrounding blanching erythema   ?Neurological:  ?   General: No focal deficit present.  ?   Mental Status: She is alert. Mental status is at baseline.  ?Psychiatric:     ?   Mood and Affect: Mood normal.     ?   Behavior: Behavior normal.  ? ?   ?Results for orders placed or performed in visit on 03/06/22  ?POCT Urinalysis Dipstick (Automated)  ?Result Value Ref Range  ? Color, UA yellow   ? Clarity, UA clear   ? Glucose, UA Negative Negative  ? Bilirubin, UA 1+   ? Ketones, UA 1+   ? Spec Grav, UA >=1.030 (A) 1.010 - 1.025  ? Blood, UA +/-   ? pH, UA 5.5 5.0 - 8.0  ? Protein, UA Positive (A) Negative  ? Urobilinogen, UA 0.2 0.2 or 1.0 E.U./dL  ? Nitrite, UA negative   ? Leukocytes, UA Trace (A) Negative  ? ?Assessment & Plan:  ? ?Problem List Items Addressed This Visit   ? ? Encounter for general adult medical examination with abnormal findings - Primary (Chronic)  ?  Preventative protocols reviewed and updated unless pt declined. ?Discussed healthy diet and lifestyle.  ? ?  ?  ? Advanced directives, counseling/discussion (Chronic)  ?  Advanced directive - does not have this set up yet. Would want brother and SIL to be HCPOA Francee Piccolo and Olivia Mackie). Packet provided today.  ?  ?  ? Glaucoma  ?  Encouraged she schedule f/u as due . ? ?  ?  ? Common migraine  ?  On  topamax, sees headache clinic ? ?  ?  ? Relevant Medications  ? lovastatin (MEVACOR) 20 MG tablet  ? Fatigue  ?  Update TSH.  ? ?  ?  ? Dyslipidemia  ?  Chol levels remain elevated off medication - she may be interested in sta

## 2022-03-06 NOTE — Assessment & Plan Note (Signed)
Preventative protocols reviewed and updated unless pt declined. Discussed healthy diet and lifestyle.  

## 2022-03-06 NOTE — Assessment & Plan Note (Signed)
Describes both urge and stress incontinence symptoms. ?Hesitant for antimuscarinic due to anticholinergic side effects.  ?UA very concentrated - she was unable to provide enough quantity to spin - will drop off new urine specimen. Offered urology referral, she declines for now.  ?

## 2022-03-06 NOTE — Assessment & Plan Note (Signed)
Update TSH

## 2022-03-06 NOTE — Assessment & Plan Note (Signed)
On topamax, sees headache clinic ?

## 2022-03-06 NOTE — Assessment & Plan Note (Addendum)
Chol levels remain elevated off medication - she may be interested in starting medication for this. She does have fmhx CAD, CVA. Discussed statin - she agrees to try lovastatin '20mg'$  daily.  ?The 10-year ASCVD risk score (Arnett DK, et al., 2019) is: 4.3% ?  Values used to calculate the score: ?    Age: 66 years ?    Sex: Female ?    Is Non-Hispanic African American: No ?    Diabetic: No ?    Tobacco smoker: No ?    Systolic Blood Pressure: 311 mmHg ?    Is BP treated: No ?    HDL Cholesterol: 74.6 mg/dL ?    Total Cholesterol: 221 mg/dL  ?

## 2022-03-06 NOTE — Assessment & Plan Note (Signed)
May have started after bug bite - ongoing for 3 wks. Rx doxycycline 5d course, update if ongoing for derm referral.  ?

## 2022-03-06 NOTE — Assessment & Plan Note (Signed)
Advanced directive - does not have this set up yet. Would want brother and SIL to be HCPOA Francee Piccolo and Olivia Mackie). Packet provided today.  ?

## 2022-03-06 NOTE — Assessment & Plan Note (Signed)
Update levels on 2000 IU daily. 

## 2022-03-07 ENCOUNTER — Encounter: Payer: Self-pay | Admitting: Family Medicine

## 2022-03-07 LAB — TSH: TSH: 1.69 u[IU]/mL (ref 0.35–5.50)

## 2022-03-07 LAB — VITAMIN D 25 HYDROXY (VIT D DEFICIENCY, FRACTURES): VITD: 35.68 ng/mL (ref 30.00–100.00)

## 2022-03-09 ENCOUNTER — Other Ambulatory Visit: Payer: BC Managed Care – PPO

## 2022-03-09 ENCOUNTER — Other Ambulatory Visit: Payer: Self-pay | Admitting: *Deleted

## 2022-03-09 DIAGNOSIS — N3946 Mixed incontinence: Secondary | ICD-10-CM

## 2022-03-10 ENCOUNTER — Encounter: Payer: Self-pay | Admitting: Family Medicine

## 2022-03-10 LAB — URINALYSIS, ROUTINE W REFLEX MICROSCOPIC
Bilirubin Urine: NEGATIVE
Hgb urine dipstick: NEGATIVE
Ketones, ur: NEGATIVE
Leukocytes,Ua: NEGATIVE
Nitrite: NEGATIVE
RBC / HPF: NONE SEEN (ref 0–?)
Specific Gravity, Urine: 1.02 (ref 1.000–1.030)
Total Protein, Urine: NEGATIVE
Urine Glucose: NEGATIVE
Urobilinogen, UA: 0.2 (ref 0.0–1.0)
WBC, UA: NONE SEEN (ref 0–?)
pH: 6.5 (ref 5.0–8.0)

## 2022-03-16 ENCOUNTER — Encounter: Payer: Self-pay | Admitting: Family Medicine

## 2022-03-16 NOTE — Progress Notes (Signed)
Replied via result note.

## 2022-03-27 ENCOUNTER — Other Ambulatory Visit (INDEPENDENT_AMBULATORY_CARE_PROVIDER_SITE_OTHER): Payer: BC Managed Care – PPO

## 2022-03-27 DIAGNOSIS — Z1211 Encounter for screening for malignant neoplasm of colon: Secondary | ICD-10-CM

## 2022-03-28 LAB — FECAL OCCULT BLOOD, IMMUNOCHEMICAL: Fecal Occult Bld: NEGATIVE

## 2022-04-12 ENCOUNTER — Encounter: Payer: Self-pay | Admitting: Family Medicine

## 2022-04-12 DIAGNOSIS — E785 Hyperlipidemia, unspecified: Secondary | ICD-10-CM

## 2022-04-13 ENCOUNTER — Encounter: Payer: Self-pay | Admitting: Family Medicine

## 2022-04-13 DIAGNOSIS — G43019 Migraine without aura, intractable, without status migrainosus: Secondary | ICD-10-CM | POA: Diagnosis not present

## 2022-04-13 DIAGNOSIS — G43719 Chronic migraine without aura, intractable, without status migrainosus: Secondary | ICD-10-CM | POA: Diagnosis not present

## 2022-04-13 MED ORDER — PRAVASTATIN SODIUM 20 MG PO TABS: 20.0000 mg | ORAL_TABLET | Freq: Every day | ORAL | 6 refills | Status: DC

## 2022-05-17 ENCOUNTER — Encounter: Payer: Self-pay | Admitting: Family Medicine

## 2022-05-17 ENCOUNTER — Ambulatory Visit: Payer: BC Managed Care – PPO | Admitting: Family Medicine

## 2022-05-17 VITALS — BP 124/68 | HR 98 | Temp 97.4°F | Ht 62.0 in | Wt 218.1 lb

## 2022-05-17 DIAGNOSIS — Z9109 Other allergy status, other than to drugs and biological substances: Secondary | ICD-10-CM | POA: Diagnosis not present

## 2022-05-17 DIAGNOSIS — T50905A Adverse effect of unspecified drugs, medicaments and biological substances, initial encounter: Secondary | ICD-10-CM | POA: Insufficient documentation

## 2022-05-17 DIAGNOSIS — E559 Vitamin D deficiency, unspecified: Secondary | ICD-10-CM | POA: Diagnosis not present

## 2022-05-17 DIAGNOSIS — L299 Pruritus, unspecified: Secondary | ICD-10-CM | POA: Insufficient documentation

## 2022-05-17 MED ORDER — EPINEPHRINE 0.3 MG/0.3ML IJ SOAJ: 0.3000 mg | INTRAMUSCULAR | 0 refills | Status: AC | PRN

## 2022-05-17 NOTE — Progress Notes (Signed)
Patient ID: Kelly Francis, female    DOB: 06-Apr-1956, 66 y.o.   MRN: [id number]  This visit was conducted in person.  BP 124/68   Pulse 98   Temp (!) 97.4 F (36.3 C) (Temporal)   Ht 5' 2"  (1.575 m)   Wt 218 lb 2 oz (98.9 kg)   SpO2 97%   BMI 39.90 kg/m    CC: discuss possible supplement reactions  Subjective:   HPI: Kelly Francis is a 66 y.o. female presenting on 05/17/2022 for Medication Reaction (C/o reaction to vit D and vit B12.  Wants to discuss other tx options. )   Has been taking vitamin D3 2000 IU daily as well as vitamin B12 53mg daily. Notes itching as well as stinging in tongue and bad headache that did improve when she held vitamin D. Over the past week noted increased itching again, despite holding vit D. Itch without a rash. Previously on B12 shots then over the past year taking oral replacement.   No fevers/chills, tongue/lip swelling, throat swelling, coughing, dyspnea, PNdrainage. No rash, no new joint pains. No dry mouth or eyes.   She regularly takes xyzal.   Lovastatin worsened migraines and caused insomnia.  Trial pravastatin - she didn't try this.  Not regularly taking omeprazole. GERD symptoms controlled with not eating after 7pm.       Relevant past medical, surgical, family and social history reviewed and updated as indicated. Interim medical history since our last visit reviewed. Allergies and medications reviewed and updated. Outpatient Medications Prior to Visit  Medication Sig Dispense Refill   estradiol (ESTRACE) 1 MG tablet Take 1 mg by mouth daily.     levocetirizine (XYZAL) 5 MG tablet Take 5 mg by mouth daily.     nitroGLYCERIN (NITROSTAT) 0.4 MG SL tablet Place 1 tablet (0.4 mg total) under the tongue every 5 (five) minutes as needed for chest pain. 25 tablet 3   omeprazole (PRILOSEC) 40 MG capsule Take 1 capsule (40 mg total) by mouth daily as needed (reflux/heartburn). 30 capsule 3   topiramate (TOPAMAX) 25 MG tablet Take 25 mg by  mouth at bedtime.     EPINEPHrine (EPIPEN) 0.3 mg/0.3 mL IJ SOAJ injection Inject 0.3 mLs (0.3 mg total) into the muscle once. (Patient taking differently: Inject 0.3 mg into the muscle daily as needed (allergic reaction).) 2 Device 0   Cholecalciferol (VITAMIN D) 50 MCG (2000 UT) CAPS Take 1 capsule (2,000 Units total) by mouth daily. (Patient not taking: Reported on 05/17/2022) 30 capsule    CVS VITAMIN B-12 500 MCG tablet TAKE 1 TABLET BY MOUTH EVERY DAY (Patient not taking: Reported on 05/17/2022) 1681tablet 3   FOLIC ACID PO Take by mouth daily. (Patient not taking: Reported on 05/17/2022)     pravastatin (PRAVACHOL) 20 MG tablet Take 1 tablet (20 mg total) by mouth daily. (Patient not taking: Reported on 05/17/2022) 30 tablet 6   doxycycline (VIBRA-TABS) 100 MG tablet Take 1 tablet (100 mg total) by mouth 2 (two) times daily. 10 tablet 0   No facility-administered medications prior to visit.     Per HPI unless specifically indicated in ROS section below Review of Systems  Objective:  BP 124/68   Pulse 98   Temp (!) 97.4 F (36.3 C) (Temporal)   Ht 5' 2"  (1.575 m)   Wt 218 lb 2 oz (98.9 kg)   SpO2 97%   BMI 39.90 kg/m   Wt Readings from Last 3 Encounters:  05/17/22 218 lb 2 oz (98.9 kg)  03/06/22 216 lb 2 oz (98 kg)  07/19/21 218 lb 2 oz (98.9 kg)      Physical Exam Vitals and nursing note reviewed.  Constitutional:      Appearance: Normal appearance. She is obese. She is not ill-appearing.  HENT:     Head: Normocephalic and atraumatic.     Mouth/Throat:     Mouth: Mucous membranes are moist.     Pharynx: Oropharynx is clear. No oropharyngeal exudate or posterior oropharyngeal erythema.  Eyes:     Extraocular Movements: Extraocular movements intact.     Conjunctiva/sclera: Conjunctivae normal.     Pupils: Pupils are equal, round, and reactive to light.  Neck:     Thyroid: No thyroid mass or thyromegaly.  Cardiovascular:     Rate and Rhythm: Normal rate and regular rhythm.      Pulses: Normal pulses.     Heart sounds: Normal heart sounds. No murmur heard. Pulmonary:     Effort: Pulmonary effort is normal. No respiratory distress.     Breath sounds: Normal breath sounds. No wheezing, rhonchi or rales.  Musculoskeletal:     Cervical back: Normal range of motion and neck supple.     Right lower leg: No edema.     Left lower leg: No edema.  Skin:    General: Skin is warm and dry.     Findings: No rash.  Neurological:     Mental Status: She is alert.  Psychiatric:        Mood and Affect: Mood normal.        Behavior: Behavior normal.       Results for orders placed or performed in visit on 03/27/22  Fecal occult blood, imunochemical   Specimen: Stool  Result Value Ref Range   Fecal Occult Bld Negative Negative   Lab Results  Component Value Date   VITAMINB12 558 01/05/2021   Lab Results  Component Value Date   VD25OH 35.68 03/06/2022   Lab Results  Component Value Date   TSH 1.69 03/06/2022    Lab Results  Component Value Date   CREATININE 0.6 12/28/2021   BUN 20 01/05/2021   NA 140 12/28/2021   K 4.2 01/05/2021   CL 104 01/05/2021   CO2 26 01/05/2021    Lab Results  Component Value Date   ALT 26 12/28/2021   AST 34 01/05/2021   ALKPHOS 121 12/28/2021   BILITOT 0.5 01/05/2021    Lab Results  Component Value Date   WBC 6.5 12/28/2021   HGB 14.1 12/28/2021   HCT 43.3 01/05/2021   MCV 89.1 01/05/2021   PLT 223 12/28/2021    Assessment & Plan:   Problem List Items Addressed This Visit     Environmental allergies    Takes xyzal for this.       Vitamin D deficiency    Itching with tongue stinging/scratching that may have improved when she stopped vitamin D3. Discussed trial vitamin D2, await allergist evaluation.       Pruritus - Primary    Significant pruritis which she initially attributed to vit D3 however has recurred and seemed better when she stopped oral b12 - rec remain off all supplements for now, update vitamin  levels as well as ESR/CRP and ANA.  Will also refer to allergist given concern over possible med reaction.       Relevant Orders   Sedimentation rate   C-reactive protein   Vitamin B12  VITAMIN D 25 Hydroxy (Vit-D Deficiency, Fractures)   ANA   Ambulatory referral to Allergy   Drug reaction    ?reaction to Vit D3 cholecalciferol or b12 oral supplement - will remain off these at this time. Discussed possibly trying vitamin D2 ergocalciferol which is more poorly absorbed.       Relevant Orders   Ambulatory referral to Allergy     Meds ordered this encounter  Medications   EPINEPHrine (EPIPEN) 0.3 mg/0.3 mL IJ SOAJ injection    Sig: Inject 0.3 mg into the muscle as needed for anaphylaxis.    Dispense:  2 each    Refill:  0   Orders Placed This Encounter  Procedures   Sedimentation rate   C-reactive protein   Vitamin B12   VITAMIN D 25 Hydroxy (Vit-D Deficiency, Fractures)   ANA   Ambulatory referral to Allergy    Referral Priority:   Routine    Referral Type:   Allergy Testing    Referral Reason:   Specialty Services Required    Requested Specialty:   Allergy    Number of Visits Requested:   1     Patient Instructions  Labs today  Look for vitamin D2 (ergochalciferol) supplements (1000 units) over the counter and try these.  Hold vitamin B12 for now, we are checking levels today  We will refer you to allergist for further evaluation.  Epi pen refilled.  Follow up plan: Return if symptoms worsen or fail to improve.  [name], MD

## 2022-05-17 NOTE — Patient Instructions (Addendum)
Labs today  Look for vitamin D2 (ergochalciferol) supplements (1000 units) over the counter and try these.  Hold vitamin B12 for now, we are checking levels today  We will refer you to allergist for further evaluation.  Epi pen refilled.

## 2022-05-17 NOTE — Assessment & Plan Note (Signed)
Itching with tongue stinging/scratching that may have improved when she stopped vitamin D3. Discussed trial vitamin D2, await allergist evaluation.

## 2022-05-17 NOTE — Assessment & Plan Note (Signed)
Takes xyzal for this.

## 2022-05-17 NOTE — Assessment & Plan Note (Signed)
?  reaction to Vit D3 cholecalciferol or b12 oral supplement - will remain off these at this time. Discussed possibly trying vitamin D2 ergocalciferol which is more poorly absorbed.

## 2022-05-17 NOTE — Assessment & Plan Note (Signed)
Significant pruritis which she initially attributed to vit D3 however has recurred and seemed better when she stopped oral b12 - rec remain off all supplements for now, update vitamin levels as well as ESR/CRP and ANA.  Will also refer to allergist given concern over possible med reaction.

## 2022-05-18 LAB — SEDIMENTATION RATE: Sed Rate: 12 mm/hr (ref 0–30)

## 2022-05-18 LAB — VITAMIN D 25 HYDROXY (VIT D DEFICIENCY, FRACTURES): VITD: 34.89 ng/mL (ref 30.00–100.00)

## 2022-05-18 LAB — C-REACTIVE PROTEIN: CRP: <1 mg/dL (ref 0.5–20.0)

## 2022-05-18 LAB — VITAMIN B12: Vitamin B-12: 1068 pg/mL — ABNORMAL HIGH (ref 211–911)

## 2022-05-19 ENCOUNTER — Encounter: Payer: Self-pay | Admitting: Family Medicine

## 2022-05-19 LAB — ANA: Anti Nuclear Antibody (ANA): NEGATIVE

## 2022-05-24 ENCOUNTER — Encounter: Payer: Self-pay | Admitting: Family Medicine

## 2022-05-25 NOTE — Telephone Encounter (Signed)
Spoke with pt apologizing for the confusion.  We discussed everything, including the letter and she understands she is to call to schedule appt.  Advised pt if she has any issues, plz contact us.  Pt verbalizes understanding and expresses her thanks.

## 2022-07-06 DIAGNOSIS — T887XXD Unspecified adverse effect of drug or medicament, subsequent encounter: Secondary | ICD-10-CM | POA: Diagnosis not present

## 2022-07-06 DIAGNOSIS — L299 Pruritus, unspecified: Secondary | ICD-10-CM | POA: Diagnosis not present

## 2022-07-06 DIAGNOSIS — J300 Vasomotor rhinitis: Secondary | ICD-10-CM | POA: Diagnosis not present

## 2022-07-06 DIAGNOSIS — J3089 Other allergic rhinitis: Secondary | ICD-10-CM | POA: Diagnosis not present

## 2022-08-09 DIAGNOSIS — T1511XA Foreign body in conjunctival sac, right eye, initial encounter: Secondary | ICD-10-CM | POA: Diagnosis not present

## 2022-08-21 ENCOUNTER — Other Ambulatory Visit: Payer: Self-pay | Admitting: Obstetrics and Gynecology

## 2022-08-21 DIAGNOSIS — Z1231 Encounter for screening mammogram for malignant neoplasm of breast: Secondary | ICD-10-CM

## 2022-10-02 ENCOUNTER — Ambulatory Visit: Payer: BC Managed Care – PPO

## 2022-10-02 ENCOUNTER — Encounter: Payer: Self-pay | Admitting: Obstetrics and Gynecology

## 2022-10-23 DIAGNOSIS — H40013 Open angle with borderline findings, low risk, bilateral: Secondary | ICD-10-CM | POA: Diagnosis not present

## 2022-10-24 DIAGNOSIS — G43019 Migraine without aura, intractable, without status migrainosus: Secondary | ICD-10-CM | POA: Diagnosis not present

## 2022-11-28 ENCOUNTER — Ambulatory Visit: Payer: BC Managed Care – PPO

## 2022-11-28 ENCOUNTER — Ambulatory Visit
Admission: RE | Admit: 2022-11-28 | Discharge: 2022-11-28 | Disposition: A | Discharge status: Home / Self Care | Payer: BC Managed Care – PPO | Source: Ambulatory Visit | Attending: Obstetrics and Gynecology | Admitting: Obstetrics and Gynecology

## 2022-11-28 DIAGNOSIS — Z1231 Encounter for screening mammogram for malignant neoplasm of breast: Secondary | ICD-10-CM

## 2023-01-09 LAB — BASIC METABOLIC PANEL
Creatinine: 0.6 (ref 0.5–1.1)
Glucose: 93
Potassium: 3.9 mEq/L (ref 3.5–5.1)
Sodium: 141 (ref 137–147)

## 2023-01-09 LAB — CBC AND DIFFERENTIAL
Hemoglobin: 13.5 (ref 12.0–16.0)
Platelets: 206 10*3/uL (ref 150–400)
WBC: 6.5

## 2023-01-09 LAB — LIPID PANEL
Cholesterol: 212 — AB (ref 0–200)
HDL: 59 (ref 35–70)
LDL Cholesterol: 98
Triglycerides: 330 — AB (ref 40–160)

## 2023-01-09 LAB — HEMOGLOBIN A1C: Hemoglobin A1C: 5.7

## 2023-01-09 LAB — HEPATIC FUNCTION PANEL
ALT: 27 U/L (ref 7–35)
AST: 28 (ref 13–35)
Alkaline Phosphatase: 111 (ref 25–125)

## 2023-01-09 LAB — COMPREHENSIVE METABOLIC PANEL
Albumin: 4.5 (ref 3.5–5.0)
Calcium: 9.1 (ref 8.7–10.7)

## 2023-01-10 ENCOUNTER — Encounter: Payer: Self-pay | Admitting: Family Medicine

## 2023-01-12 MED ORDER — EZETIMIBE 10 MG PO TABS: 10.0000 mg | ORAL_TABLET | Freq: Every day | ORAL | 3 refills | Status: DC

## 2023-03-13 ENCOUNTER — Encounter: Payer: Self-pay | Admitting: Family Medicine

## 2023-03-13 ENCOUNTER — Ambulatory Visit (INDEPENDENT_AMBULATORY_CARE_PROVIDER_SITE_OTHER): Payer: BC Managed Care – PPO | Admitting: Family Medicine

## 2023-03-13 VITALS — BP 132/78 | HR 89 | Temp 97.3°F | Ht 61.5 in | Wt 217.2 lb

## 2023-03-13 DIAGNOSIS — E2839 Other primary ovarian failure: Secondary | ICD-10-CM

## 2023-03-13 DIAGNOSIS — N3946 Mixed incontinence: Secondary | ICD-10-CM

## 2023-03-13 DIAGNOSIS — Z79890 Hormone replacement therapy (postmenopausal): Secondary | ICD-10-CM

## 2023-03-13 DIAGNOSIS — R5383 Other fatigue: Secondary | ICD-10-CM

## 2023-03-13 DIAGNOSIS — Z1211 Encounter for screening for malignant neoplasm of colon: Secondary | ICD-10-CM

## 2023-03-13 DIAGNOSIS — R7303 Prediabetes: Secondary | ICD-10-CM

## 2023-03-13 DIAGNOSIS — L299 Pruritus, unspecified: Secondary | ICD-10-CM

## 2023-03-13 DIAGNOSIS — E785 Hyperlipidemia, unspecified: Secondary | ICD-10-CM | POA: Diagnosis not present

## 2023-03-13 DIAGNOSIS — E559 Vitamin D deficiency, unspecified: Secondary | ICD-10-CM

## 2023-03-13 DIAGNOSIS — Z7189 Other specified counseling: Secondary | ICD-10-CM

## 2023-03-13 DIAGNOSIS — Z0000 Encounter for general adult medical examination without abnormal findings: Secondary | ICD-10-CM

## 2023-03-13 DIAGNOSIS — G43009 Migraine without aura, not intractable, without status migrainosus: Secondary | ICD-10-CM

## 2023-03-13 NOTE — Assessment & Plan Note (Signed)
Will continue working on this.

## 2023-03-13 NOTE — Patient Instructions (Addendum)
Return for fasting labs at your convenience one afternoon.  Pass by lab for stool kit.   Call to schedule bone density scan at your convenience: [facility] [phone] Keep working on advanced directive.  Good to see you today Return as needed or in 1 year for next physical or welcome to medicare visit.

## 2023-03-13 NOTE — Progress Notes (Signed)
Ph: [phone]       Fax: [phone]   Patient ID: Kelly Francis, female    DOB: May 25, 1956, 67 y.o.   MRN: [id number]  This visit was conducted in person.  BP 132/78   Pulse 89   Temp (!) 97.3 F (36.3 C) (Temporal)   Ht 5' 1.5" (1.562 m)   Wt 217 lb 4 oz (98.5 kg)   SpO2 96%   BMI 40.38 kg/m    CC: CPE Subjective:   HPI: Kelly Francis is a 67 y.o. female presenting on 03/13/2023 for Annual Exam (Requests vit B12 and vit D be checked. Provided work PE form to be completed. )   Has Medicare part A.   Saw allergist [place] Callas) 07/2022 for allergic reaction fall 2023 to ?b12 vs vit D3 - no cause found, rec xyzal.  She has stayed off vitamin D3 and B12 with resolution in pruritus.  She previously took B12 shots with benefit.  She did not tolerate vitamin D 50k units.  She would be open to retrial pending lab results.   Nocturia x2-4, daytime urination. H/o double kidney. Mixed incontinence.    HLD - on zetia 10mg  daily, overall tolerating well. Intolerance to other statins tried including lovastatin (itching), never tried pravastatin. Desires to avoid statins.    Preventative: Colon screening - yearly iFOB normal.  Well woman through OBGYN at [facility] (Dr Cassell Clement) s/p hysterectomy 1998, ovaries remain. Has stopped going.  [facility]  DEXA - will refer for this  Lung cancer screen - not eligible  Declines flu shot.  COVID vaccine pfizer 02/2020 x2, no booster Prevnar-20 - declines today Tdap 2017 Shingrix - 02/2021, 05/2021 Advanced directive - does not have this set up yet. Would want brother and SIL to be HCPOA [place] and [place] Ana). Packet previously provided.  Seat belt use discussed.   Sunscreen use discussed. No changing moles.  Non smoker  Alcohol - rare  Dentist q6 mo Eye exam - yearly Bowel - no constipation Bladder - + incontinence - see above   Lives alone.  Went through bad divorce 1 son, 30s, lives in  [place]. 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 Interim medical history since our last visit reviewed. Allergies and medications reviewed and updated. Outpatient Medications Prior to Visit  Medication Sig Dispense Refill   EPINEPHrine (EPIPEN) 0.3 mg/0.3 mL IJ SOAJ injection Inject 0.3 mg into the muscle as needed for anaphylaxis. 2 each 0   estradiol (ESTRACE) 1 MG tablet Take 1 mg by mouth daily.     levocetirizine (XYZAL) 5 MG tablet Take 5 mg by mouth daily.     nitroGLYCERIN (NITROSTAT) 0.4 MG SL tablet Place 1 tablet (0.4 mg total) under the tongue every 5 (five) minutes as needed for chest pain. 25 tablet 3   omeprazole (PRILOSEC) 40 MG capsule Take 1 capsule (40 mg total) by mouth daily as needed (reflux/heartburn). 30 capsule 3   topiramate (TOPAMAX) 25 MG tablet Take 25 mg by mouth at bedtime.     ezetimibe (ZETIA) 10 MG tablet Take 1 tablet (10 mg total) by mouth daily. 30 tablet 3   Cholecalciferol (VITAMIN D) 50 MCG (2000 UT) CAPS Take 1 capsule (2,000 Units total) by mouth daily. (Patient not taking: Reported on 05/17/2022) 30 capsule  [employer] VITAMIN B-12 500 MCG tablet TAKE 1 TABLET BY MOUTH EVERY DAY (Patient not taking: Reported on 05/17/2022) 100 tablet 3   FOLIC ACID PO Take by mouth daily. (Patient not taking: Reported on 03/13/2023)     pravastatin (PRAVACHOL) 20 MG tablet Take 1 tablet (20 mg total) by mouth daily. 30 tablet 6   No facility-administered medications prior to visit.     Per HPI unless specifically indicated in ROS section below Review of Systems  Constitutional:  Negative for activity change, appetite change, chills, fatigue, fever and unexpected weight change.  HENT:  Negative for hearing loss.   Eyes:  Negative for visual disturbance.  Respiratory:  Negative for cough, chest tightness, shortness  of breath and wheezing.   Cardiovascular:  Negative for chest pain, palpitations and leg swelling.  Gastrointestinal:  Negative for abdominal distention, abdominal pain, blood in stool, constipation, diarrhea, nausea and vomiting.  Genitourinary:  Negative for difficulty urinating and hematuria.  Musculoskeletal:  Negative for arthralgias, myalgias and neck pain.  Skin:  Negative for rash.  Neurological:  Positive for headaches (occ, allergy related). Negative for dizziness, seizures and syncope.  Hematological:  Negative for adenopathy. Bruises/bleeds easily.  Psychiatric/Behavioral:  Negative for dysphoric mood. The patient is not nervous/anxious.     Objective:  BP 132/78   Pulse 89   Temp (!) 97.3 F (36.3 C) (Temporal)   Ht 5' 1.5" (1.562 m)   Wt 217 lb 4 oz (98.5 kg)   SpO2 96%   BMI 40.38 kg/m   Wt Readings from Last 3 Encounters:  03/13/23 217 lb 4 oz (98.5 kg)  05/17/22 218 lb 2 oz (98.9 kg)  03/06/22 216 lb 2 oz (98 kg)      Physical Exam Vitals and nursing note reviewed.  Constitutional:      Appearance: Normal appearance. She is not ill-appearing.  HENT:     Head: Normocephalic and atraumatic.     Right Ear: Tympanic membrane, ear canal and external ear normal. There is no impacted cerumen.     Left Ear: Tympanic membrane, ear canal and external ear normal. There is no impacted cerumen.     Nose: Nose normal.     Mouth/Throat:     Mouth: Mucous membranes are moist.     Pharynx: Oropharynx is clear. No oropharyngeal exudate or posterior oropharyngeal erythema.  Eyes:     General:        Right eye: No discharge.        Left eye: No discharge.     Extraocular Movements: Extraocular movements intact.     Conjunctiva/sclera: Conjunctivae normal.     Pupils: Pupils are equal, round, and reactive to light.  Neck:     Thyroid: No thyroid mass or thyromegaly.     Vascular: No carotid bruit.  Cardiovascular:     Rate and Rhythm: Normal rate and regular rhythm.      Pulses: Normal pulses.     Heart sounds: Normal heart sounds. No murmur heard. Pulmonary:     Effort: Pulmonary effort is normal. No respiratory distress.     Breath sounds: Normal breath sounds. No wheezing, rhonchi or rales.  Abdominal:     General: Bowel sounds are normal. There is no distension.     Palpations: Abdomen is soft. There is no mass.     Tenderness: There is no abdominal tenderness. There is no guarding or rebound.     Hernia: No hernia is present.  Musculoskeletal:  Cervical back: Normal range of motion and neck supple. No rigidity.     Right lower leg: No edema.     Left lower leg: No edema.  Lymphadenopathy:     Cervical: No cervical adenopathy.  Skin:    General: Skin is warm and dry.     Findings: No rash.  Neurological:     General: No focal deficit present.     Mental Status: She is alert. Mental status is at baseline.  Psychiatric:        Mood and Affect: Mood normal.        Behavior: Behavior normal.       Results for orders placed or performed in visit on 01/10/23  CBC and differential  Result Value Ref Range   Platelets 206 150 - 400 K/uL  CBC and differential  Result Value Ref Range   Hemoglobin 13.5 12.0 - 16.0   WBC 6.5   Basic metabolic panel  Result Value Ref Range   Glucose 93    Creatinine 0.6 0.5 - 1.1   Potassium 3.9 3.5 - 5.1 mEq/L   Sodium 141 137 - 147  Comprehensive metabolic panel  Result Value Ref Range   Calcium 9.1 8.7 - 10.7   Albumin 4.5 3.5 - 5.0  Lipid panel  Result Value Ref Range   Triglycerides 330 (A) 40 - 160   Cholesterol 212 (A) 0 - 200   HDL 59 35 - 70   LDL Cholesterol 98   Hepatic function panel  Result Value Ref Range   Alkaline Phosphatase 111 25 - 125   ALT 27 7 - 35 U/L   AST 28 13 - 35  Hemoglobin A1c  Result Value Ref Range   Hemoglobin A1C 5.7     Assessment & Plan:   Problem List Items Addressed This Visit     Common migraine    [facility]ax [facility]       Relevant  Medications   ezetimibe (ZETIA) 10 MG tablet   Fatigue   Relevant Orders   Vitamin B12   TSH   CBC with Differential/Platelet   Health maintenance examination - Primary (Chronic)    Preventative protocols reviewed and updated unless pt declined. Discussed healthy diet and lifestyle.       Dyslipidemia    Chronic, only on zetia 10mg  daily. Desires to avoid statin. Will update FLP when she returns fasting.  She does have fmhx CAD and CVA The 10-year ASCVD risk score (Arnett DK, et al., 2019) is: 5.9%*   Values used to calculate the score:     Age: 69 years     Sex: Female     Is Non-Hispanic African American: No     Diabetic: No     Tobacco smoker: No     Systolic Blood Pressure: 132 mmHg     Is BP treated: No     HDL Cholesterol: 59 mg/dL*     Total Cholesterol: 212 mg/dL*     * - Cholesterol units were assumed for this score calculation       Relevant Medications   ezetimibe (ZETIA) 10 MG tablet   Other Relevant Orders   Lipid panel   Comprehensive metabolic panel   Obesity, morbid, BMI 40.0-49.9 (HCC)    Encourage healthy diet and lifestyle choices for goal sustainable weight loss.       Postmenopausal hormone replacement therapy    Was on estrogen through GYN. S/p hysterectomy, no longer going to  GYN.       Vitamin D deficiency    Update levels off replacement.       Relevant Orders   VITAMIN D 25 Hydroxy (Vit-D Deficiency, Fractures)   Mixed stress and urge urinary incontinence    Notes ongoing trouble with this. UA previously normal.       Advanced directives, counseling/discussion (Chronic)    Will continue working on this.       Pruritus    Presumed pruritus to vitamin supplementation as this improved once B12 and D were stopped.       Prediabetes    Update A1c       Relevant Orders   Hemoglobin A1c   Other Visit Diagnoses     Special screening for malignant neoplasms, colon       Relevant Orders   Fecal occult blood, imunochemical    Estrogen deficiency       Relevant Orders   DG Bone Density        Meds ordered this encounter  Medications   ezetimibe (ZETIA) 10 MG tablet    Sig: Take 1 tablet (10 mg total) by mouth daily.    Dispense:  90 tablet    Refill:  4    Orders Placed This Encounter  Procedures   Fecal occult blood, imunochemical    Standing Status:   Future    Standing Expiration Date:   03/12/2024   DG Bone Density    Standing Status:   Future    Standing Expiration Date:   03/12/2024    Order Specific Question:   Reason for Exam (SYMPTOM  OR DIAGNOSIS REQUIRED)    Answer:   osteoporosis screen    Order Specific Question:   Preferred imaging location?    Answer:   [facility]   Lipid panel    Standing Status:   Future    Standing Expiration Date:   03/14/2024   Comprehensive metabolic panel    Standing Status:   Future    Standing Expiration Date:   03/14/2024   VITAMIN D 25 Hydroxy (Vit-D Deficiency, Fractures)    Standing Status:   Future    Standing Expiration Date:   03/14/2024   Vitamin B12    Standing Status:   Future    Standing Expiration Date:   03/14/2024   TSH    Standing Status:   Future    Standing Expiration Date:   03/14/2024   CBC with Differential/Platelet    Standing Status:   Future    Standing Expiration Date:   03/14/2024   Hemoglobin A1c    Standing Status:   Future    Standing Expiration Date:   03/14/2024    Patient Instructions  Return for fasting labs at your convenience one afternoon.  Pass by lab for stool kit.   Call to schedule bone density scan at your convenience: [facility] [phone] Keep working on advanced directive.  Good to see you today Return as needed or in 1 year for next physical or welcome to medicare visit.   Follow up plan: Return in about 1 year (around 03/12/2024) for annual exam, prior fasting for blood work.  Eustaquio Boyden, MD

## 2023-03-13 NOTE — Assessment & Plan Note (Signed)
Preventative protocols reviewed and updated unless pt declined. Discussed healthy diet and lifestyle.  

## 2023-03-15 ENCOUNTER — Encounter: Payer: Self-pay | Admitting: Family Medicine

## 2023-03-15 DIAGNOSIS — R7303 Prediabetes: Secondary | ICD-10-CM | POA: Insufficient documentation

## 2023-03-15 MED ORDER — EZETIMIBE 10 MG PO TABS: 10.0000 mg | ORAL_TABLET | Freq: Every day | ORAL | 4 refills | Status: DC

## 2023-03-15 NOTE — Assessment & Plan Note (Signed)
Notes ongoing trouble with this. UA previously normal.

## 2023-03-15 NOTE — Assessment & Plan Note (Addendum)
Encourage healthy diet and lifestyle choices for goal sustainable weight loss.

## 2023-03-15 NOTE — Assessment & Plan Note (Signed)
Update A1c ?

## 2023-03-15 NOTE — Assessment & Plan Note (Deleted)
?  reaction to vit D3 or B12 supplement presenting with pruritus. See above.

## 2023-03-15 NOTE — Assessment & Plan Note (Signed)
On [facility]amax through [facility]

## 2023-03-15 NOTE — Assessment & Plan Note (Signed)
Was on estrogen through GYN. S/p hysterectomy, no longer going to GYN.

## 2023-03-15 NOTE — Assessment & Plan Note (Addendum)
Chronic, only on zetia 10mg  daily. Desires to avoid statin. Will update FLP when she returns fasting.  She does have fmhx CAD and CVA The 10-year ASCVD risk score (Arnett DK, et al., 2019) is: 5.9%*   Values used to calculate the score:     Age: 66 years     Sex: Female     Is Non-Hispanic African American: No     Diabetic: No     Tobacco smoker: No     Systolic Blood Pressure: 132 mmHg     Is BP treated: No     HDL Cholesterol: 59 mg/dL*     Total Cholesterol: 212 mg/dL*     * - Cholesterol units were assumed for this score calculation

## 2023-03-15 NOTE — Assessment & Plan Note (Signed)
Presumed pruritus to vitamin supplementation as this improved once B12 and D were stopped.

## 2023-03-15 NOTE — Assessment & Plan Note (Signed)
Update levels off replacement. 

## 2023-03-21 ENCOUNTER — Other Ambulatory Visit (INDEPENDENT_AMBULATORY_CARE_PROVIDER_SITE_OTHER): Payer: BC Managed Care – PPO

## 2023-03-21 DIAGNOSIS — E559 Vitamin D deficiency, unspecified: Secondary | ICD-10-CM | POA: Diagnosis not present

## 2023-03-21 DIAGNOSIS — Z1211 Encounter for screening for malignant neoplasm of colon: Secondary | ICD-10-CM | POA: Diagnosis not present

## 2023-03-21 DIAGNOSIS — R7303 Prediabetes: Secondary | ICD-10-CM | POA: Diagnosis not present

## 2023-03-21 DIAGNOSIS — E785 Hyperlipidemia, unspecified: Secondary | ICD-10-CM

## 2023-03-21 DIAGNOSIS — R5383 Other fatigue: Secondary | ICD-10-CM | POA: Diagnosis not present

## 2023-03-22 LAB — CBC WITH DIFFERENTIAL/PLATELET
Basophils Absolute: 0.1 10*3/uL (ref 0.0–0.1)
Basophils Relative: 1 % (ref 0.0–3.0)
Eosinophils Absolute: 0.1 10*3/uL (ref 0.0–0.7)
Eosinophils Relative: 0.9 % (ref 0.0–5.0)
HCT: 41.7 % (ref 36.0–46.0)
Hemoglobin: 13.9 g/dL (ref 12.0–15.0)
Lymphocytes Relative: 31.5 % (ref 12.0–46.0)
Lymphs Abs: 2.3 10*3/uL (ref 0.7–4.0)
MCHC: 33.4 g/dL (ref 30.0–36.0)
MCV: 88 fl (ref 78.0–100.0)
Monocytes Absolute: 0.6 10*3/uL (ref 0.1–1.0)
Monocytes Relative: 8.5 % (ref 3.0–12.0)
Neutro Abs: 4.3 10*3/uL (ref 1.4–7.7)
Neutrophils Relative %: 58.1 % (ref 43.0–77.0)
Platelets: 213 10*3/uL (ref 150.0–400.0)
RBC: 4.74 Mil/uL (ref 3.87–5.11)
RDW: 14.6 % (ref 11.5–15.5)
WBC: 7.4 10*3/uL (ref 4.0–10.5)

## 2023-03-22 LAB — COMPREHENSIVE METABOLIC PANEL
ALT: 21 U/L (ref 0–35)
AST: 24 U/L (ref 0–37)
Albumin: 4.1 g/dL (ref 3.5–5.2)
Alkaline Phosphatase: 90 U/L (ref 39–117)
BUN: 18 mg/dL (ref 6–23)
CO2: 29 mEq/L (ref 19–32)
Calcium: 9.3 mg/dL (ref 8.4–10.5)
Chloride: 104 mEq/L (ref 96–112)
Creatinine, Ser: 0.71 mg/dL (ref 0.40–1.20)
GFR: 89.33 mL/min (ref >60.00)
Glucose, Bld: 95 mg/dL (ref 70–99)
Potassium: 3.9 mEq/L (ref 3.5–5.1)
Sodium: 140 mEq/L (ref 135–145)
Total Bilirubin: 0.3 mg/dL (ref 0.2–1.2)
Total Protein: 6.7 g/dL (ref 6.0–8.3)

## 2023-03-22 LAB — VITAMIN B12: Vitamin B-12: 235 pg/mL (ref 211–911)

## 2023-03-22 LAB — LIPID PANEL
Cholesterol: 188 mg/dL (ref 0–200)
HDL: 60.6 mg/dL (ref >39.00)
LDL Cholesterol: 98 mg/dL (ref 0–99)
NonHDL: 127.34
Total CHOL/HDL Ratio: 3
Triglycerides: 145 mg/dL (ref 0.0–149.0)
VLDL: 29 mg/dL (ref 0.0–40.0)

## 2023-03-22 LAB — VITAMIN D 25 HYDROXY (VIT D DEFICIENCY, FRACTURES): VITD: 25.46 ng/mL — ABNORMAL LOW (ref 30.00–100.00)

## 2023-03-22 LAB — TSH: TSH: 1.84 u[IU]/mL (ref 0.35–5.50)

## 2023-03-22 LAB — HEMOGLOBIN A1C: Hgb A1c MFr Bld: 5.9 % (ref 4.6–6.5)

## 2023-03-23 LAB — FECAL OCCULT BLOOD, IMMUNOCHEMICAL: Fecal Occult Bld: NEGATIVE

## 2023-04-24 DIAGNOSIS — G43719 Chronic migraine without aura, intractable, without status migrainosus: Secondary | ICD-10-CM | POA: Diagnosis not present

## 2023-11-05 DIAGNOSIS — G43019 Migraine without aura, intractable, without status migrainosus: Secondary | ICD-10-CM | POA: Diagnosis not present

## 2023-11-28 ENCOUNTER — Encounter: Payer: Self-pay | Admitting: Family Medicine

## 2023-11-28 ENCOUNTER — Ambulatory Visit: Payer: BC Managed Care – PPO | Admitting: Family Medicine

## 2023-11-28 VITALS — BP 124/70 | HR 94 | Temp 98.1°F | Ht 61.5 in | Wt 210.4 lb

## 2023-11-28 DIAGNOSIS — N200 Calculus of kidney: Secondary | ICD-10-CM | POA: Diagnosis not present

## 2023-11-28 DIAGNOSIS — R109 Unspecified abdominal pain: Secondary | ICD-10-CM | POA: Diagnosis not present

## 2023-11-28 LAB — POC URINALSYSI DIPSTICK (AUTOMATED)
Glucose, UA: NEGATIVE
Ketones, UA: NEGATIVE
Nitrite, UA: NEGATIVE
Protein, UA: POSITIVE — AB
Spec Grav, UA: >=1.03 — AB (ref 1.010–1.025)
Urobilinogen, UA: 0.2 U/dL
pH, UA: 6 (ref 5.0–8.0)

## 2023-11-28 NOTE — Patient Instructions (Addendum)
 Urine did have some blood and calcium crystals.  Symptoms don't sound like kidney stone however.  Push water. Gentle stretching. Heating pad to area.  Let us  know if worsening again for xray of kidney.

## 2023-11-28 NOTE — Assessment & Plan Note (Addendum)
 Story/exam not consistent with kidney stone although UA does have signs of crystals and blood.  Discussed may have non-obstructive kidney stone. Urine was also concentrated - encouraged increased water intake. Offered KUB - she declines. She will let us  know if developing symptoms of kidney stone to come in for KUB.  Anticipate more MSK cause such as L latissiums dorsi muscle strain - she has been more active in the past few weeks caring for her mother. Rec supportive measures of heating pad, gentle stretching. She declines muscle relaxant - has not tolerated these well in the past.

## 2023-11-28 NOTE — Assessment & Plan Note (Addendum)
 UA today with some blood and calcium oxalate crystals.  However symptoms not consistent with kidney stone. She will let us  know if worsening symptoms to return for KUB.  Discussed relation of topamax and kidney stone development - she is on low dose, has been on this med for long period of time.

## 2023-11-28 NOTE — Progress Notes (Addendum)
 Ph: [phone] Fax: [phone]   Patient ID: Kelly Francis, female    DOB: June 01, 1956, 68 y.o.   MRN: [id number]  This visit was conducted in person.  BP 124/70   Pulse 94   Temp 98.1 F (36.7 C) (Oral)   Ht 5' 1.5" (1.562 m)   Wt 210 lb 6 oz (95.4 kg)   SpO2 97%   BMI 39.11 kg/m    CC: L flank pain  Subjective:   HPI: Kelly Francis is a 68 y.o. female presenting on 11/28/2023 for Flank Pain (C/o L flank pain. Sxs started about 3 wks ago. Concern due to h/o kidney stones. )   3 wk h/o left flank discomfort associated with known chronic back pain. No colicky flank or abdominal pain.  Painful to sleep on left for the past 3 weeks.  Chronic urinary frequency.  No blood in urine, no dysuria, urgency, or incomplete emptying. No fevers/chills, nausea/vomiting.  Hasn't passed any stones.  H/o kidney stones, none recently.  This doesn't feel like prior kidney stones.   She is on topamax 25mg  nightly - discussed relation of this medicine and kidney stone formation.   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 May seek 2nd opinion with [facility].  Mother recently hospitalized with CHF, stage 4 CKD, DM.      Relevant past medical, surgical, family and social history reviewed and updated as indicated. Interim medical history since our last visit reviewed. Allergies and medications reviewed and updated. Outpatient Medications Prior to Visit  Medication Sig Dispense Refill   EPINEPHrine  (EPIPEN ) 0.3 mg/0.3 mL IJ SOAJ injection Inject 0.3 mg into the muscle as needed for anaphylaxis. 2 each 0   estradiol (ESTRACE) 1 MG tablet Take 1 mg by mouth daily.     ezetimibe  (ZETIA ) 10 MG tablet Take 1 tablet (10 mg total) by mouth daily. 90 tablet 4   levocetirizine (XYZAL) 5 MG tablet Take 5 mg by mouth daily.     nitroGLYCERIN  (NITROSTAT ) 0.4 MG SL tablet Place 1 tablet (0.4 mg total) under the tongue every 5 (five) minutes as needed  for chest pain. 25 tablet 3   omeprazole  (PRILOSEC) 40 MG capsule Take 1 capsule (40 mg total) by mouth daily as needed (reflux/heartburn). 30 capsule 3   Semaglutide,0.25 or 0.5MG /DOS, (OZEMPIC, 0.25 OR 0.5 MG/DOSE,) 2 MG/3ML SOPN Inject into the skin once a week.     topiramate (TOPAMAX) 25 MG tablet Take 25 mg by mouth at bedtime.     No facility-administered medications prior to visit.     Per HPI unless specifically indicated in ROS section below Review of Systems  Objective:  BP 124/70   Pulse 94   Temp 98.1 F (36.7 C) (Oral)   Ht 5' 1.5" (1.562 m)   Wt 210 lb 6 oz (95.4 kg)   SpO2 97%   BMI 39.11 kg/m   Wt Readings from Last 3 Encounters:  11/28/23 210 lb 6 oz (95.4 kg)  03/13/23 217 lb 4 oz (98.5 kg)  05/17/22 218 lb 2 oz (98.9 kg)      Physical Exam Vitals and nursing note reviewed.  Constitutional:      Appearance: Normal appearance. She is not ill-appearing.  HENT:     Head: Normocephalic and atraumatic.     Mouth/Throat:     Mouth: Mucous membranes are moist.     Pharynx: Oropharynx is clear. No oropharyngeal exudate or posterior oropharyngeal  erythema.  Eyes:     Conjunctiva/sclera: Conjunctivae normal.     Pupils: Pupils are equal, round, and reactive to light.  Cardiovascular:     Rate and Rhythm: Normal rate and regular rhythm.     Pulses: Normal pulses.     Heart sounds: Normal heart sounds. No murmur heard. Pulmonary:     Effort: Pulmonary effort is normal. No respiratory distress.     Breath sounds: Normal breath sounds. No wheezing or rales.  Abdominal:     General: Bowel sounds are normal. There is no distension.     Palpations: Abdomen is soft. There is no mass.     Tenderness: There is no abdominal tenderness. There is no right CVA tenderness, left CVA tenderness, guarding or rebound.     Hernia: No hernia is present.  Musculoskeletal:       Arms:     Comments: Point tender to palpation at L lower ribcage above flank  Skin:    General:  Skin is warm and dry.     Findings: No rash.  Neurological:     Mental Status: She is alert.  Psychiatric:        Behavior: Behavior normal.       Results for orders placed or performed in visit on 11/28/23  POCT Urinalysis Dipstick (Automated)   Collection Time: 11/28/23  3:59 PM  Result Value Ref Range   Color, UA dark yellow    Clarity, UA clear    Glucose, UA Negative Negative   Bilirubin, UA 1+    Ketones, UA negative    Spec Grav, UA >=1.030 (A) 1.010 - 1.025   Blood, UA +/-    pH, UA 6.0 5.0 - 8.0   Protein, UA Positive (A) Negative   Urobilinogen, UA 0.2 0.2 or 1.0 E.U./dL   Nitrite, UA negative    Leukocytes, UA Small (1+) (A) Negative    Assessment & Plan:   Problem List Items Addressed This Visit     Kidney stones   UA today with some blood and calcium oxalate crystals.  However symptoms not consistent with kidney stone. She will let us  know if worsening symptoms to return for KUB.  Discussed relation of topamax and kidney stone development - she is on low dose, has been on this med for long period of time.       Acute left flank pain - Primary   Story/exam not consistent with kidney stone although UA does have signs of crystals and blood.  Discussed may have non-obstructive kidney stone. Urine was also concentrated - encouraged increased water intake. Offered KUB - she declines. She will let us  know if developing symptoms of kidney stone to come in for KUB.  Anticipate more MSK cause such as L latissiums dorsi muscle strain - she has been more active in the past few weeks caring for her mother. Rec supportive measures of heating pad, gentle stretching. She declines muscle relaxant - has not tolerated these well in the past.       Relevant Orders   POCT Urinalysis Dipstick (Automated) (Completed)     No orders of the defined types were placed in this encounter.   Orders Placed This Encounter  Procedures   POCT Urinalysis Dipstick (Automated)     Patient Instructions  Urine did have some blood and calcium crystals.  Symptoms don't sound like kidney stone however.  Push water. Gentle stretching. Heating pad to area.  Let us  know if worsening again for xray  of kidney.   Follow up plan: No follow-ups on file.  Claire Crick, MD

## 2023-12-03 ENCOUNTER — Telehealth: Payer: Self-pay | Admitting: Family Medicine

## 2023-12-03 DIAGNOSIS — R109 Unspecified abdominal pain: Secondary | ICD-10-CM

## 2023-12-03 NOTE — Telephone Encounter (Signed)
Spoke with pt and scheduled lab visit for UA and abd x-ray on 12/04/23 at 2:30.

## 2023-12-03 NOTE — Telephone Encounter (Signed)
Spoke with pt asking about sxs. States pain worsened over the weekend and states Dr Reece Agar said to let him know if that happened and he will order an x-ray. Pt is requesting to have x-ray done. Plz advise.

## 2023-12-03 NOTE — Addendum Note (Signed)
Addended by: Eustaquio Boyden on: 12/03/2023 04:12 PM   Modules accepted: Orders

## 2023-12-03 NOTE — Telephone Encounter (Signed)
Copied from CRM [id number]. Topic: Clinical - Medical Advice >> Dec 03, 2023  2:14 PM Elizebeth Brooking wrote: Reason for CRM: Patient called in stating that she schedule for the 28th but stated that Dr.G stated that if she needed to come back sooner she can just call in. No available appointments until the 28th but needs to be sooner , so she is requesting a callback

## 2023-12-03 NOTE — Telephone Encounter (Addendum)
KUB ordered Would suggest she repeat UA as well - also ordered.

## 2023-12-04 ENCOUNTER — Other Ambulatory Visit (INDEPENDENT_AMBULATORY_CARE_PROVIDER_SITE_OTHER): Payer: BC Managed Care – PPO

## 2023-12-04 ENCOUNTER — Ambulatory Visit (INDEPENDENT_AMBULATORY_CARE_PROVIDER_SITE_OTHER)
Admission: RE | Admit: 2023-12-04 | Discharge: 2023-12-04 | Disposition: A | Discharge status: Home / Self Care | Payer: BC Managed Care – PPO | Source: Ambulatory Visit | Attending: Family Medicine | Admitting: Family Medicine

## 2023-12-04 DIAGNOSIS — R109 Unspecified abdominal pain: Secondary | ICD-10-CM

## 2023-12-05 ENCOUNTER — Encounter: Payer: Self-pay | Admitting: Family Medicine

## 2023-12-05 LAB — URINALYSIS, ROUTINE W REFLEX MICROSCOPIC
Bilirubin Urine: NEGATIVE
Hgb urine dipstick: NEGATIVE
Ketones, ur: NEGATIVE
Leukocytes,Ua: NEGATIVE
Nitrite: NEGATIVE
RBC / HPF: NONE SEEN (ref 0–?)
Specific Gravity, Urine: >=1.03 — AB (ref 1.000–1.030)
Total Protein, Urine: NEGATIVE
Urine Glucose: NEGATIVE
Urobilinogen, UA: 0.2 (ref 0.0–1.0)
WBC, UA: NONE SEEN (ref 0–?)
pH: 5.5 (ref 5.0–8.0)

## 2023-12-06 MED ORDER — TAMSULOSIN HCL 0.4 MG PO CAPS
0.4000 mg | ORAL_CAPSULE | Freq: Every day | ORAL | 0 refills | Status: DC
Start: 2023-12-06 — End: 2024-05-02

## 2023-12-06 NOTE — Addendum Note (Signed)
Addended by: Eustaquio Boyden on: 12/06/2023 08:59 AM   Modules accepted: Orders

## 2023-12-11 ENCOUNTER — Ambulatory Visit: Payer: BC Managed Care – PPO | Admitting: Family Medicine

## 2023-12-12 ENCOUNTER — Encounter: Payer: Self-pay | Admitting: Family Medicine

## 2024-03-05 ENCOUNTER — Other Ambulatory Visit: Payer: Self-pay | Admitting: Obstetrics and Gynecology

## 2024-03-05 DIAGNOSIS — Z1231 Encounter for screening mammogram for malignant neoplasm of breast: Secondary | ICD-10-CM

## 2024-03-09 ENCOUNTER — Other Ambulatory Visit: Payer: Self-pay | Admitting: Family Medicine

## 2024-03-09 DIAGNOSIS — R3129 Other microscopic hematuria: Secondary | ICD-10-CM

## 2024-03-09 DIAGNOSIS — E559 Vitamin D deficiency, unspecified: Secondary | ICD-10-CM

## 2024-03-09 DIAGNOSIS — E538 Deficiency of other specified B group vitamins: Secondary | ICD-10-CM

## 2024-03-09 DIAGNOSIS — R7303 Prediabetes: Secondary | ICD-10-CM

## 2024-03-09 DIAGNOSIS — E785 Hyperlipidemia, unspecified: Secondary | ICD-10-CM

## 2024-03-10 ENCOUNTER — Other Ambulatory Visit (INDEPENDENT_AMBULATORY_CARE_PROVIDER_SITE_OTHER): Payer: BC Managed Care – PPO

## 2024-03-10 DIAGNOSIS — E559 Vitamin D deficiency, unspecified: Secondary | ICD-10-CM

## 2024-03-10 DIAGNOSIS — R7303 Prediabetes: Secondary | ICD-10-CM | POA: Diagnosis not present

## 2024-03-10 DIAGNOSIS — E538 Deficiency of other specified B group vitamins: Secondary | ICD-10-CM | POA: Diagnosis not present

## 2024-03-10 DIAGNOSIS — R3129 Other microscopic hematuria: Secondary | ICD-10-CM

## 2024-03-10 DIAGNOSIS — E785 Hyperlipidemia, unspecified: Secondary | ICD-10-CM | POA: Diagnosis not present

## 2024-03-10 NOTE — Addendum Note (Signed)
 Addended by: Gerry Krone on: 03/10/2024 03:47 PM   Modules accepted: Orders

## 2024-03-10 NOTE — Addendum Note (Signed)
 Addended by: Gerry Krone on: 03/10/2024 03:48 PM   Modules accepted: Orders

## 2024-03-11 LAB — VITAMIN B12: Vitamin B-12: 164 pg/mL — ABNORMAL LOW (ref 211–911)

## 2024-03-11 LAB — COMPREHENSIVE METABOLIC PANEL WITH GFR
ALT: 18 U/L (ref 0–35)
AST: 15 U/L (ref 0–37)
Albumin: 4.3 g/dL (ref 3.5–5.2)
Alkaline Phosphatase: 83 U/L (ref 39–117)
BUN: 20 mg/dL (ref 6–23)
CO2: 27 meq/L (ref 19–32)
Calcium: 9.5 mg/dL (ref 8.4–10.5)
Chloride: 105 meq/L (ref 96–112)
Creatinine, Ser: 0.6 mg/dL (ref 0.40–1.20)
GFR: 93.66 mL/min (ref >60.00)
Glucose, Bld: 90 mg/dL (ref 70–99)
Potassium: 3.5 meq/L (ref 3.5–5.1)
Sodium: 139 meq/L (ref 135–145)
Total Bilirubin: 0.4 mg/dL (ref 0.2–1.2)
Total Protein: 6.8 g/dL (ref 6.0–8.3)

## 2024-03-11 LAB — LIPID PANEL
Cholesterol: 189 mg/dL (ref 0–200)
HDL: 57.9 mg/dL (ref >39.00)
LDL Cholesterol: 94 mg/dL (ref 0–99)
NonHDL: 130.94
Total CHOL/HDL Ratio: 3
Triglycerides: 185 mg/dL — ABNORMAL HIGH (ref 0.0–149.0)
VLDL: 37 mg/dL (ref 0.0–40.0)

## 2024-03-11 LAB — VITAMIN D 25 HYDROXY (VIT D DEFICIENCY, FRACTURES): VITD: 18.81 ng/mL — ABNORMAL LOW (ref 30.00–100.00)

## 2024-03-11 LAB — HEMOGLOBIN A1C: Hgb A1c MFr Bld: 5.7 % (ref 4.6–6.5)

## 2024-03-14 ENCOUNTER — Encounter: Payer: BC Managed Care – PPO | Admitting: Family Medicine

## 2024-03-17 ENCOUNTER — Ambulatory Visit

## 2024-03-24 ENCOUNTER — Ambulatory Visit
Admission: RE | Admit: 2024-03-24 | Discharge: 2024-03-24 | Disposition: A | Discharge status: Home / Self Care | Source: Ambulatory Visit | Attending: Obstetrics and Gynecology | Admitting: Obstetrics and Gynecology

## 2024-03-24 DIAGNOSIS — Z1231 Encounter for screening mammogram for malignant neoplasm of breast: Secondary | ICD-10-CM

## 2024-03-27 ENCOUNTER — Other Ambulatory Visit: Payer: Self-pay | Admitting: Obstetrics and Gynecology

## 2024-03-27 DIAGNOSIS — R928 Other abnormal and inconclusive findings on diagnostic imaging of breast: Secondary | ICD-10-CM

## 2024-03-31 ENCOUNTER — Ambulatory Visit
Admission: RE | Admit: 2024-03-31 | Discharge: 2024-03-31 | Disposition: A | Discharge status: Home / Self Care | Source: Ambulatory Visit | Attending: Obstetrics and Gynecology | Admitting: Obstetrics and Gynecology

## 2024-03-31 ENCOUNTER — Other Ambulatory Visit: Payer: Self-pay | Admitting: Obstetrics and Gynecology

## 2024-03-31 DIAGNOSIS — N6320 Unspecified lump in the left breast, unspecified quadrant: Secondary | ICD-10-CM

## 2024-03-31 DIAGNOSIS — N6315 Unspecified lump in the right breast, overlapping quadrants: Secondary | ICD-10-CM | POA: Diagnosis not present

## 2024-03-31 DIAGNOSIS — N6342 Unspecified lump in left breast, subareolar: Secondary | ICD-10-CM | POA: Diagnosis not present

## 2024-03-31 DIAGNOSIS — N6001 Solitary cyst of right breast: Secondary | ICD-10-CM | POA: Diagnosis not present

## 2024-03-31 DIAGNOSIS — R928 Other abnormal and inconclusive findings on diagnostic imaging of breast: Secondary | ICD-10-CM

## 2024-03-31 DIAGNOSIS — D242 Benign neoplasm of left breast: Secondary | ICD-10-CM | POA: Diagnosis not present

## 2024-03-31 DIAGNOSIS — N6042 Mammary duct ectasia of left breast: Secondary | ICD-10-CM | POA: Diagnosis not present

## 2024-03-31 DIAGNOSIS — R92323 Mammographic fibroglandular density, bilateral breasts: Secondary | ICD-10-CM | POA: Diagnosis not present

## 2024-03-31 DIAGNOSIS — R922 Inconclusive mammogram: Secondary | ICD-10-CM | POA: Diagnosis not present

## 2024-03-31 DIAGNOSIS — R92322 Mammographic fibroglandular density, left breast: Secondary | ICD-10-CM | POA: Diagnosis not present

## 2024-03-31 HISTORY — PX: BREAST BIOPSY: SHX20

## 2024-04-01 LAB — SURGICAL PATHOLOGY

## 2024-04-03 ENCOUNTER — Other Ambulatory Visit: Payer: Self-pay | Admitting: Obstetrics and Gynecology

## 2024-04-03 DIAGNOSIS — N63 Unspecified lump in unspecified breast: Secondary | ICD-10-CM

## 2024-04-08 ENCOUNTER — Other Ambulatory Visit: Payer: Self-pay | Admitting: Obstetrics and Gynecology

## 2024-04-08 DIAGNOSIS — N63 Unspecified lump in unspecified breast: Secondary | ICD-10-CM

## 2024-04-23 ENCOUNTER — Encounter

## 2024-04-23 ENCOUNTER — Other Ambulatory Visit

## 2024-04-30 ENCOUNTER — Other Ambulatory Visit: Payer: Self-pay | Admitting: Family Medicine

## 2024-04-30 DIAGNOSIS — E785 Hyperlipidemia, unspecified: Secondary | ICD-10-CM

## 2024-05-02 ENCOUNTER — Ambulatory Visit (INDEPENDENT_AMBULATORY_CARE_PROVIDER_SITE_OTHER): Admitting: Family Medicine

## 2024-05-02 ENCOUNTER — Other Ambulatory Visit: Payer: Self-pay | Admitting: Radiology

## 2024-05-02 ENCOUNTER — Encounter: Payer: Self-pay | Admitting: Family Medicine

## 2024-05-02 VITALS — BP 126/84 | HR 87 | Temp 98.4°F | Ht 61.5 in | Wt 184.5 lb

## 2024-05-02 DIAGNOSIS — E785 Hyperlipidemia, unspecified: Secondary | ICD-10-CM

## 2024-05-02 DIAGNOSIS — E559 Vitamin D deficiency, unspecified: Secondary | ICD-10-CM

## 2024-05-02 DIAGNOSIS — E538 Deficiency of other specified B group vitamins: Secondary | ICD-10-CM | POA: Diagnosis not present

## 2024-05-02 DIAGNOSIS — Z1211 Encounter for screening for malignant neoplasm of colon: Secondary | ICD-10-CM | POA: Diagnosis not present

## 2024-05-02 DIAGNOSIS — R3129 Other microscopic hematuria: Secondary | ICD-10-CM

## 2024-05-02 DIAGNOSIS — R7303 Prediabetes: Secondary | ICD-10-CM

## 2024-05-02 DIAGNOSIS — N200 Calculus of kidney: Secondary | ICD-10-CM

## 2024-05-02 DIAGNOSIS — Z7189 Other specified counseling: Secondary | ICD-10-CM

## 2024-05-02 DIAGNOSIS — Z0000 Encounter for general adult medical examination without abnormal findings: Secondary | ICD-10-CM

## 2024-05-02 DIAGNOSIS — E66811 Obesity, class 1: Secondary | ICD-10-CM

## 2024-05-02 MED ORDER — EZETIMIBE 10 MG PO TABS: 10.0000 mg | ORAL_TABLET | Freq: Every day | ORAL | 3 refills | Status: AC

## 2024-05-02 NOTE — Patient Instructions (Addendum)
 I will order cardiac CT with calcium score.  Try to find methylcobalamin b12 supplement  Continue vitamin D  1000 units every other day  Work on living will /advanced directive and bring us  copy when completed.  Schedule bone density scan at your convenience  I will sign you up for cologuard.  Return as needed or in 1 year for next physical Bring in copy of labwork 12/2024.

## 2024-05-02 NOTE — Addendum Note (Signed)
 Addended by: Gerry Krone on: 05/02/2024 02:57 PM   Modules accepted: Orders

## 2024-05-02 NOTE — Assessment & Plan Note (Signed)
 Advanced directive - does not have this set up yet. Would want brother and SIL to be HCPOA [place] and [place]ine). Packet previously provided.

## 2024-05-02 NOTE — Progress Notes (Unsigned)
 Ph: [phone] Fax: [phone]   Patient ID: Kelly Francis, female    DOB: Apr 09, 1956, 68 y.o.   MRN: [id number]  This visit was conducted in person.  BP 126/84   Pulse 87   Temp 98.4 F (36.9 C) (Oral)   Ht 5' 1.5 (1.562 m)   Wt 184 lb 8 oz (83.7 kg)   SpO2 97%   BMI 34.30 kg/m    CC: CPE Subjective:   HPI: Kelly Francis is a 68 y.o. female presenting on 05/02/2024 for Annual Exam (Pt states she dropped off urine sample in 'Specimen Box' to have A1c checked. Says messages were exchanged between she and Dr Crissie Dome. )   Has Medicare part A.   Mother with CHF. Maternal aunt with CAD.    She got COVID infection last month - she has since recovered.   Started ozempic 1mg  weekly - tolerating this well. Getting this through [facility] in [place] - $500/month. She asks about getting this covered through her insurance.   Saw allergist Almeda Jacobs) 07/2022 for allergic reaction fall 2023 to ?b12 vs vit D3 - no cause found, rec xyzal.  She has stayed off vitamin D3 and B12 with resolution in pruritus.  She previously took B12 shots with benefit.  She did not tolerate vitamin D  50k units.  She would be open to retrial pending lab results.   HLD - on zetia  10mg  daily, overall tolerating well. Intolerance to other statins tried including lovastatin  (itching), never tried pravastatin . Desires to avoid statins.    Preventative: Colon screening - yearly iFOB normal.  Well woman through OBGYN at [facility] (Dr Michele Ahle) s/p hysterectomy 1998, ovaries remain. Has stopped going.  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  COVID vaccine pfizer 02/2020 x2, no booster Prevnar-20 - declines today Tdap 2017 Shingrix - 02/2021, 05/2021 Advanced directive - does not have this set up yet. Would want brother and SIL to be HCPOA Audrene Blessing and Sherrlyn Dolores). Packet previously provided.  Seat belt use discussed.   Sunscreen use discussed. No changing moles.  Non smoker  Alcohol - rare  Dentist q6 mo Eye exam - yearly Bowel - no constipation Bladder - mild incontinence   Lives alone.  Went through bad divorce 1 son, 30s, lives in [place]. Occupation: [place], buyer  Activity: yardwork  Diet: daily fruits/vegetables, unsweet tea, some water     Relevant past medical, surgical, family and social history reviewed and updated as indicated. Interim medical history since our last visit reviewed. Allergies and medications reviewed and updated. Outpatient Medications Prior to Visit  Medication Sig Dispense Refill   EPINEPHrine  (EPIPEN ) 0.3 mg/0.3 mL IJ SOAJ injection Inject 0.3 mg into the muscle as needed for anaphylaxis. 2 each 0   estradiol (ESTRACE) 1 MG tablet Take 1 mg by mouth daily.     levocetirizine (XYZAL) 5 MG tablet Take 5 mg by mouth daily.     nitroGLYCERIN  (NITROSTAT ) 0.4 MG SL tablet Place 1 tablet (0.4 mg total) under the tongue every 5 (five) minutes as needed for chest pain. 25 tablet 3   omeprazole  (PRILOSEC) 40 MG capsule Take 1 capsule (40  mg total) by mouth daily as needed (reflux/heartburn). 30 capsule 3   Semaglutide (OZEMPIC, 1 MG/DOSE, [place]) Inject into the skin once a week.     topiramate (TOPAMAX) 25 MG tablet Take 25 mg by mouth at bedtime.     ezetimibe  (ZETIA ) 10 MG tablet TAKE 1 TABLET BY MOUTH EVERY DAY 90 tablet 0   Semaglutide,0.25 or 0.5MG /DOS, (OZEMPIC, 0.25 OR 0.5 MG/DOSE,) 2 MG/3ML SOPN Inject into the skin once a week.     tamsulosin  (FLOMAX ) 0.4 MG CAPS capsule Take 1 capsule (0.4 mg total) by mouth daily. 10 capsule 0   No facility-administered medications prior to visit.      Per HPI unless specifically indicated in ROS section below Review of Systems  Constitutional:  Negative for activity change, appetite change, chills, fatigue, fever and unexpected weight change.  HENT:  Negative for hearing loss.   Eyes:  Negative for visual disturbance.  Respiratory:  Negative for cough, chest tightness, shortness of breath and wheezing.   Cardiovascular:  Negative for chest pain, palpitations and leg swelling.  Gastrointestinal:  Negative for abdominal distention, abdominal pain, blood in stool, constipation, diarrhea, nausea and vomiting.  Genitourinary:  Negative for difficulty urinating and hematuria.  Musculoskeletal:  Negative for arthralgias, myalgias and neck pain.  Skin:  Negative for rash.  Neurological:  Negative for dizziness, seizures, syncope and headaches.  Hematological:  Negative for adenopathy. Does not bruise/bleed easily.  Psychiatric/Behavioral:  Negative for dysphoric mood. The patient is not nervous/anxious.     Objective:  BP 126/84   Pulse 87   Temp 98.4 F (36.9 C) (Oral)   Ht 5' 1.5 (1.562 m)   Wt 184 lb 8 oz (83.7 kg)   SpO2 97%   BMI 34.30 kg/m   Wt Readings from Last 3 Encounters:  05/02/24 184 lb 8 oz (83.7 kg)  11/28/23 210 lb 6 oz (95.4 kg)  03/13/23 217 lb 4 oz (98.5 kg)      Physical Exam Vitals and nursing note reviewed.  Constitutional:      Appearance: Normal appearance. She is not ill-appearing.  HENT:     Head: Normocephalic and atraumatic.     Right Ear: Tympanic membrane, ear canal and external ear normal. There is no impacted cerumen.     Left Ear: Tympanic membrane, ear canal and external ear normal. There is no impacted cerumen.     Mouth/Throat:     Mouth: Mucous membranes are moist.     Pharynx: Oropharynx is clear. No oropharyngeal exudate or posterior oropharyngeal erythema.   Eyes:     General:        Right eye: No discharge.        Left eye: No discharge.     Extraocular Movements: Extraocular  movements intact.     Conjunctiva/sclera: Conjunctivae normal.     Pupils: Pupils are equal, round, and reactive to light.   Neck:     Thyroid : No thyroid  mass or thyromegaly.     Vascular: No carotid bruit.   Cardiovascular:     Rate and Rhythm: Normal rate and regular rhythm.     Pulses: Normal pulses.     Heart sounds: Normal heart sounds. No murmur heard. Pulmonary:     Effort: Pulmonary effort is normal. No respiratory distress.     Breath sounds: Normal breath sounds. No wheezing, rhonchi or rales.  Abdominal:     General: Bowel sounds are normal. There is no distension.     Palpations: Abdomen  is soft. There is no mass.     Tenderness: There is no abdominal tenderness. There is no guarding or rebound.     Hernia: No hernia is present.   Musculoskeletal:     Cervical back: Normal range of motion and neck supple. No rigidity.     Right lower leg: No edema.     Left lower leg: No edema.  Lymphadenopathy:     Cervical: No cervical adenopathy.   Skin:    General: Skin is warm and dry.     Findings: No rash.   Neurological:     General: No focal deficit present.     Mental Status: She is alert. Mental status is at baseline.   Psychiatric:        Mood and Affect: Mood normal.        Behavior: Behavior normal.       Results for orders placed or performed during the hospital encounter of 03/31/24  Surgical pathology   Collection Time: 03/31/24 12:00 AM  Result Value Ref Range   SURGICAL PATHOLOGY      SURGICAL PATHOLOGY [facility] [address], [address] [place], [place] [address] Telephone [phone] or [phone] Fax [phone]  REPORT OF SURGICAL PATHOLOGY   Accession #: [id number] Patient Name: LACI, FRENKEL Visit # : [id number]  MRN: [id number] Physician: Allena Ito DOB/Age Oct 20, 1956 (Age: 89) Gender: F Collected Date: 03/31/2024 Received Date: 03/31/2024  FINAL DIAGNOSIS       1. Breast, left, needle core  biopsy, 8:00 retroareolar (ribbon clip) :       -  LARGE CYSTICALLY DILATED DUCT CONSISTING PREDOMINANTLY OF      PROTEINACEOUS/MUCINOUS MATERIAL WITH FREE-FLOATING PAPILLARY FRONDS WITH USUAL      DUCTAL HYPERPLASIA CONSISTENT WITH ASSOCIATED INTRADUCTAL PAPILLOMA.       DATE SIGNED OUT: 04/01/2024 ELECTRONIC SIGNATURE : Legolvan Do, Mark, Pathologist, Electronic Signature  MICROSCOPIC DESCRIPTION  CASE COMMENTS STAINS USED IN DIAGNOSIS: H&E-2 H&E-3 H&E-4 H&E    CLINICAL HISTORY  SPECIMEN(S) OB TAINED 1. Breast, left, needle core biopsy, 8:00 Retroareolar (ribbon Clip)  SPECIMEN COMMENTS: 1. TIF: 3:15pm, CIT: < SPECIMEN CLINICAL INFORMATION: 1. Papilloma excision, small oval mass likely benign ? papilloma, FCC    Gross Description 1. Received in formalin labeled with the patient's name and left breast 8 o'clock retroareolar, and consists of multiple pieces of tan yellow fibroadipose tissue and mucin, ranging from 0.4 x 0.4 x 0.2 cm to 1.9 x 0.2 x 0.2 cm.  The specimen is entirely submitted in one cassette.      TIF 3:15 p.m. on 03/31/24. CIT less than 1 min.  (KL:gt, 04/01/24)        Report signed out from the following location(s) [place] [address] [place], [place] [address] CLIA #: [id number]  [facility] [address] [place], [place] [address] CLIA #: [id number]     Assessment & Plan:   Problem List Items Addressed This Visit     Health maintenance examination - Primary (Chronic)   Preventative protocols reviewed and updated unless pt declined. Discussed healthy diet and lifestyle.       Advanced directives, counseling/discussion (Chronic)   Advanced directive - does not have this set up yet. Would want brother and SIL to be HCPOA Audrene Blessing and Sherrlyn Dolores). Packet previously provided.       Dyslipidemia   Relevant Medications   ezetimibe  (ZETIA ) 10 MG tablet   Other Relevant  Orders   CT CARDIAC  SCORING (SELF PAY ONLY)   Other Visit Diagnoses       Special screening for malignant neoplasms, colon       Relevant Orders   Cologuard        Meds ordered this encounter  Medications   ezetimibe  (ZETIA ) 10 MG tablet    Sig: Take 1 tablet (10 mg total) by mouth daily.    Dispense:  90 tablet    Refill:  3    Orders Placed This Encounter  Procedures   CT CARDIAC SCORING (SELF PAY ONLY)    Standing Status:   Future    Expiration Date:   05/02/2025    Preferred imaging location?:   [facility]   Cologuard    Patient Instructions  I will order cardiac CT with calcium score.  Try to find methylcobalamin b12 supplement  Continue vitamin D  1000 units every other day  Work on living will /advanced directive and bring us  copy when completed.  Schedule bone density scan at your convenience  I will sign you up for cologuard.  Return as needed or in 1 year for next physical Bring in copy of labwork 12/2024.  Follow up plan: Return in about 1 year (around 05/02/2025) for follow up visit.  Claire Crick, MD

## 2024-05-02 NOTE — Assessment & Plan Note (Signed)
 Preventative protocols reviewed and updated unless pt declined. Discussed healthy diet and lifestyle.

## 2024-05-03 ENCOUNTER — Encounter: Payer: Self-pay | Admitting: Family Medicine

## 2024-05-03 LAB — URINALYSIS, ROUTINE W REFLEX MICROSCOPIC
Bilirubin Urine: NEGATIVE
Glucose, UA: NEGATIVE
Hgb urine dipstick: NEGATIVE
Hyaline Cast: NONE SEEN /LPF
Ketones, ur: NEGATIVE
Nitrite: NEGATIVE
Protein, ur: NEGATIVE
RBC / HPF: NONE SEEN /HPF (ref 0–2)
Specific Gravity, Urine: 1.019 (ref 1.001–1.035)
pH: 6 (ref 5.0–8.0)

## 2024-05-03 LAB — EXTRA URINE SPECIMEN

## 2024-05-03 LAB — MICROSCOPIC MESSAGE

## 2024-05-05 ENCOUNTER — Other Ambulatory Visit: Payer: Self-pay | Admitting: Family Medicine

## 2024-05-05 ENCOUNTER — Encounter: Payer: Self-pay | Admitting: Family Medicine

## 2024-05-05 ENCOUNTER — Ambulatory Visit: Payer: Self-pay | Admitting: Family Medicine

## 2024-05-05 DIAGNOSIS — G43019 Migraine without aura, intractable, without status migrainosus: Secondary | ICD-10-CM | POA: Diagnosis not present

## 2024-05-05 MED ORDER — SEMAGLUTIDE-WEIGHT MANAGEMENT 1 MG/0.5ML ~~LOC~~ SOAJ: 1.0000 mg | SUBCUTANEOUS | 3 refills | Status: DC

## 2024-05-05 NOTE — Assessment & Plan Note (Signed)
 H/o b12 intolerance - suggested restart oral b12 but search for methyl-cobalamin instead of cyanocobalamin 

## 2024-05-05 NOTE — Assessment & Plan Note (Addendum)
 Levels low off replacement - intermittently taking vit D 1000 units - will continue every other day dosing.

## 2024-05-05 NOTE — Assessment & Plan Note (Addendum)
 Latest A1c 5.7 (03/11/2023) while on ozempic - discussed too soon to recheck this.  Starting A1c prior to ozempic - 5.9%.

## 2024-05-05 NOTE — Assessment & Plan Note (Signed)
 Statin intolerant - lovastatin  caused HA, insomnia.  Only on zetia  10mg  daily.  Latest FLP showing LDL 94, nonHDL 130.  She does have fmhx CAD.  Discussed checking baseline cardiac CT with [facility] - she is interested. The 10-year ASCVD risk score (Arnett DK, et al., 2019) is: 5.7%   Values used to calculate the score:     Age: 68 years     Clincally relevant sex: Female     Is Non-Hispanic African American: No     Diabetic: No     Tobacco smoker: No     Systolic Blood Pressure: 126 mmHg     Is BP treated: No     HDL Cholesterol: 57.9 mg/dL     Total Cholesterol: 189 mg/dL

## 2024-05-05 NOTE — Assessment & Plan Note (Addendum)
 Update UA r/o hematuria. Denies current symptoms She is on topamax.

## 2024-05-05 NOTE — Assessment & Plan Note (Addendum)
 Ozempic started 2025 through [facility] clinic in [place] with 33 lb weight loss (217-->184) Discussed likely poor insurance coverage for this - she requests attempt for PA - will Rx wegovy 1mg  weekly to price out.  Ozempic started with BMI 40s

## 2024-05-07 NOTE — Telephone Encounter (Signed)
 Plz submit PA for Wegovy 1 mg.

## 2024-05-07 NOTE — Telephone Encounter (Signed)
 Noted

## 2024-05-08 ENCOUNTER — Other Ambulatory Visit (HOSPITAL_COMMUNITY): Payer: Self-pay

## 2024-05-08 ENCOUNTER — Telehealth: Payer: Self-pay

## 2024-05-08 NOTE — Telephone Encounter (Signed)
 Pharmacy Patient Advocate Encounter   Received notification from Patient Pharmacy that prior authorization for [facility] is required/requested.   Insurance verification completed.   The patient is insured through [facility] .   Per test claim: patient plan has plan benefit exclusion. Anti obesity medications are not covered.

## 2024-05-08 NOTE — Telephone Encounter (Signed)
Replied via other message.  

## 2024-05-12 ENCOUNTER — Ambulatory Visit
Admission: RE | Admit: 2024-05-12 | Discharge: 2024-05-12 | Disposition: A | Discharge status: Home / Self Care | Source: Ambulatory Visit | Attending: Family Medicine | Admitting: Family Medicine

## 2024-05-12 DIAGNOSIS — E785 Hyperlipidemia, unspecified: Secondary | ICD-10-CM | POA: Insufficient documentation

## 2024-05-12 NOTE — Telephone Encounter (Signed)
 Not covered.

## 2024-05-17 ENCOUNTER — Ambulatory Visit: Payer: Self-pay | Admitting: Family Medicine

## 2024-05-17 DIAGNOSIS — E785 Hyperlipidemia, unspecified: Secondary | ICD-10-CM

## 2024-06-01 DIAGNOSIS — Z1211 Encounter for screening for malignant neoplasm of colon: Secondary | ICD-10-CM | POA: Diagnosis not present

## 2024-06-06 LAB — COLOGUARD: COLOGUARD: NEGATIVE

## 2024-09-01 ENCOUNTER — Encounter: Payer: Self-pay | Admitting: Family Medicine

## 2024-09-01 ENCOUNTER — Ambulatory Visit: Admitting: Family Medicine

## 2024-09-01 DIAGNOSIS — Z6832 Body mass index (BMI) 32.0-32.9, adult: Secondary | ICD-10-CM | POA: Diagnosis not present

## 2024-09-01 DIAGNOSIS — R7303 Prediabetes: Secondary | ICD-10-CM

## 2024-09-01 MED ORDER — SEMAGLUTIDE (2 MG/DOSE) 8 MG/3ML ~~LOC~~ SOPN: 2.0000 mg | PEN_INJECTOR | SUBCUTANEOUS | 5 refills | Status: AC

## 2024-09-01 MED ORDER — SEMAGLUTIDE (2 MG/DOSE) 8 MG/3ML ~~LOC~~ SOPN
2.0000 mg | PEN_INJECTOR | SUBCUTANEOUS | 6 refills | Status: DC
Start: 2024-09-01 — End: 2024-09-01

## 2024-09-01 NOTE — Assessment & Plan Note (Signed)
 Baseline BMI

## 2024-09-01 NOTE — Progress Notes (Signed)
 Ph: [phone] Fax: [phone]   Patient ID: Kelly Francis, female    DOB: 06/13/1956, 68 y.o.   MRN: [id number]  This visit was conducted in person.  BP 124/82   Pulse 87   Temp 98.3 F (36.8 C) (Oral)   Ht 5' 1.5 (1.562 m)   Wt 176 lb 4 oz (79.9 kg)   SpO2 97%   BMI 32.76 kg/m    CC: weight f/u visit  Subjective:   HPI: Kelly Francis is a 68 y.o. female presenting on 09/01/2024 for Weight Loss   Starting weight: 210 lbs 4 oz  BMI 39 (11/28/2023) - on home scales 220 lbs Last weight: 184 lbs (05/02/2024) Today's weight 176 lbs  BMI 32.76 09/01/24   On ozempic  2mg  weekly [facility] [place] ~$600/month.  On higher dose for the past 2 months and tolerating well.  She requests written Rx for Ozempic  2mg  to take to [facility] for discount price as [facility] no longer prescribing GLP1s.   She tolerates this dose well without nausea, diarrhea, constipation, epigastric pain, or thyroid  mass/fullness.  S/p cholecystectomy.   24 hour recall: 11:30am brunch - 2 scrambled eggs, 2 slices of bacon, toast, unsweet tea  7pm sprite at bowling alley 8:30pm jello with some fruit   Activity regimen: Limited due to thoracic back pain   She took cancer test through work - pending results.      Relevant past medical, surgical, family and social history reviewed and updated as indicated. Interim medical history since our last visit reviewed. Allergies and medications reviewed and updated. Outpatient Medications Prior to Visit  Medication Sig Dispense Refill   EPINEPHrine  (EPIPEN ) 0.3 mg/0.3 mL IJ SOAJ injection Inject 0.3 mg into the muscle as needed for anaphylaxis. 2 each 0   estradiol (ESTRACE) 1 MG tablet Take 1 mg by mouth daily.     ezetimibe  (ZETIA ) 10 MG tablet Take 1 tablet (10 mg total) by mouth daily. 90 tablet 3   levocetirizine (XYZAL) 5 MG tablet Take 5 mg by mouth daily.     nitroGLYCERIN  (NITROSTAT ) 0.4 MG SL tablet Place 1 tablet (0.4 mg total)  under the tongue every 5 (five) minutes as needed for chest pain. 25 tablet 3   omeprazole  (PRILOSEC) 40 MG capsule Take 1 capsule (40 mg total) by mouth daily as needed (reflux/heartburn). 30 capsule 3   topiramate (TOPAMAX) 25 MG tablet Take 25 mg by mouth at bedtime.     Semaglutide  (OZEMPIC , 1 MG/DOSE, [place]) Inject into the skin once a week.     Semaglutide  (OZEMPIC , 2 MG/DOSE, [place])      Semaglutide -Weight Management 1 MG/0.5ML SOAJ Inject 1 mg into the skin once a week. 2 mL 3   No facility-administered medications prior to visit.     Per HPI unless specifically indicated in ROS section below Review of Systems  Objective:  BP 124/82   Pulse 87   Temp 98.3 F (36.8 C) (Oral)   Ht 5' 1.5 (1.562 m)   Wt 176 lb 4 oz (79.9 kg)   SpO2 97%   BMI 32.76 kg/m   Wt Readings from Last 3 Encounters:  09/01/24 176 lb 4 oz (79.9 kg)  05/02/24 184 lb 8 oz (83.7 kg)  11/28/23 210 lb 6 oz (95.4 kg)      Physical Exam Vitals and nursing note reviewed.  Constitutional:      Appearance: Normal appearance. She is not ill-appearing.  HENT:  Head: Normocephalic and atraumatic.     Mouth/Throat:     Mouth: Mucous membranes are moist.     Pharynx: Oropharynx is clear. No oropharyngeal exudate or posterior oropharyngeal erythema.  Eyes:     Extraocular Movements: Extraocular movements intact.     Conjunctiva/sclera: Conjunctivae normal.     Pupils: Pupils are equal, round, and reactive to light.  Cardiovascular:     Rate and Rhythm: Normal rate and regular rhythm.     Pulses: Normal pulses.     Heart sounds: Normal heart sounds. No murmur heard. Pulmonary:     Effort: Pulmonary effort is normal. No respiratory distress.     Breath sounds: Normal breath sounds. No wheezing, rhonchi or rales.  Abdominal:     General: Bowel sounds are normal. There is no distension.     Palpations: Abdomen is soft. There is no mass.     Tenderness: There is no abdominal tenderness. There is no guarding or  rebound.     Hernia: No hernia is present.  Musculoskeletal:     Right lower leg: No edema.     Left lower leg: No edema.  Skin:    General: Skin is warm and dry.     Findings: No rash.  Neurological:     Mental Status: She is alert.  Psychiatric:        Mood and Affect: Mood normal.        Behavior: Behavior normal.       Results for orders placed or performed in visit on 05/02/24  Cologuard   Collection Time: 06/01/24  5:03 PM  Result Value Ref Range   COLOGUARD Negative Negative    Assessment & Plan:   Problem List Items Addressed This Visit     Body mass index (BMI) of 32.0 to 32.9 in adult   Congratulated on ongoing weight loss, total of >35 lbs since starting semaglutide  11/2023.  Prior provider has stopped providing weight loss medications [facility] in [place]). However she has learned of new program at [facility] that will provide semaglutide  for discount at $500/month for members - she requests Rx for Ozempic  2mg  to be able to take to [facility]. States this is not through insurance but directly through [facility].  Reviewed Ozempic  is diabetes brand and Wegovy  would be weight loss brand for semaglutide  - she states she wants to stay on Ozempic  which is most similar to what she has been taking.  I have printed Rx for Ozempic  2mg  weekly to take to [facility] pharmacy.  Reviewed mechanism of action of medication as well as side effects and adverse events to watch for including nausea, diarrhea, constipation, pancreatitis. Reviewed importance of strength training to prevent muscle loss.  Return in 3 months for weight managemenet visit.  Will need to verify no fmhx medullary thyroid  cancer or MEN2.       Prediabetes   Notes improved sugar control with weight loss.       Morbid obesity baseline BMI 39.11 (11/28/2023) - Primary   Baseline BMI       Relevant Medications   Semaglutide , 2 MG/DOSE, 8 MG/3ML SOPN     Meds ordered this encounter  Medications   DISCONTD:  Semaglutide , 2 MG/DOSE, 8 MG/3ML SOPN    Sig: Inject 2 mg as directed once a week.    Dispense:  3 mL    Refill:  6   Semaglutide , 2 MG/DOSE, 8 MG/3ML SOPN    Sig: Inject 2 mg as directed once  a week.    Dispense:  3 mL    Refill:  5    No orders of the defined types were placed in this encounter.   Patient Instructions  Price out ozempic  at [facility] - printed out today.  Ensure good protein intake, and work on Armed forces technical officer to maintain muscle strength.  Good to see you today Return as needed or in 3 months for weight follow up visit   Follow up plan: Return in about 3 months (around 12/02/2024) for follow up visit.  Anton Blas, MD

## 2024-09-01 NOTE — Assessment & Plan Note (Addendum)
 Congratulated on ongoing weight loss, total of >35 lbs since starting semaglutide  11/2023.  Prior provider has stopped providing weight loss medications [facility] in [place]). However she has learned of new program at [facility] that will provide semaglutide  for discount at $500/month for members - she requests Rx for Ozempic  2mg  to be able to take to [facility]. States this is not through insurance but directly through [employer].  Reviewed Ozempic  is diabetes brand and Wegovy  would be weight loss brand for semaglutide  - she states she wants to stay on Ozempic  which is most similar to what she has been taking.  I have printed Rx for Ozempic  2mg  weekly to take to [facility] pharmacy.  Reviewed mechanism of action of medication as well as side effects and adverse events to watch for including nausea, diarrhea, constipation, pancreatitis. Reviewed importance of strength training to prevent muscle loss.  Return in 3 months for weight managemenet visit.  Will need to verify no fmhx medullary thyroid  cancer or MEN2.

## 2024-09-01 NOTE — Patient Instructions (Addendum)
 Price out ozempic  at [facility] - printed out today.  Ensure good protein intake, and work on Armed forces technical officer to maintain muscle strength.  Good to see you today Return as needed or in 3 months for weight follow up visit

## 2024-09-01 NOTE — Assessment & Plan Note (Signed)
 Notes improved sugar control with weight loss.

## 2024-10-02 ENCOUNTER — Other Ambulatory Visit: Payer: Self-pay | Admitting: Family Medicine

## 2024-10-02 DIAGNOSIS — N63 Unspecified lump in unspecified breast: Secondary | ICD-10-CM

## 2024-10-03 ENCOUNTER — Ambulatory Visit

## 2024-10-03 ENCOUNTER — Ambulatory Visit
Admission: RE | Admit: 2024-10-03 | Discharge: 2024-10-03 | Disposition: A | Discharge status: Home / Self Care | Source: Ambulatory Visit | Attending: Obstetrics and Gynecology | Admitting: Obstetrics and Gynecology

## 2024-10-03 DIAGNOSIS — N63 Unspecified lump in unspecified breast: Secondary | ICD-10-CM

## 2024-10-03 DIAGNOSIS — R928 Other abnormal and inconclusive findings on diagnostic imaging of breast: Secondary | ICD-10-CM | POA: Diagnosis not present

## 2024-10-06 ENCOUNTER — Other Ambulatory Visit: Payer: Self-pay | Admitting: Family Medicine

## 2024-10-06 ENCOUNTER — Ambulatory Visit: Payer: Self-pay | Admitting: Family Medicine

## 2024-10-06 DIAGNOSIS — R928 Other abnormal and inconclusive findings on diagnostic imaging of breast: Secondary | ICD-10-CM

## 2024-10-07 ENCOUNTER — Other Ambulatory Visit: Payer: Self-pay | Admitting: Family Medicine

## 2024-10-07 DIAGNOSIS — Z1231 Encounter for screening mammogram for malignant neoplasm of breast: Secondary | ICD-10-CM

## 2024-10-07 DIAGNOSIS — R928 Other abnormal and inconclusive findings on diagnostic imaging of breast: Secondary | ICD-10-CM

## 2024-11-05 DIAGNOSIS — G43719 Chronic migraine without aura, intractable, without status migrainosus: Secondary | ICD-10-CM | POA: Diagnosis not present

## 2024-12-05 ENCOUNTER — Ambulatory Visit: Admitting: Family Medicine

## 2024-12-22 ENCOUNTER — Ambulatory Visit: Admitting: Family Medicine

## 2025-01-07 ENCOUNTER — Ambulatory Visit: Admitting: Family Medicine

## 2025-01-21 ENCOUNTER — Ambulatory Visit: Admitting: Family Medicine
